# Patient Record
Sex: Male | Born: 1945 | Race: White | Hispanic: No | Marital: Married | Smoking: Never smoker
Health system: Southern US, Community
[De-identification: ages and names within clinical notes are randomized; demographics above are authoritative.]

## PROBLEM LIST (undated history)

## (undated) DIAGNOSIS — I10 Essential (primary) hypertension: Secondary | ICD-10-CM

## (undated) DIAGNOSIS — E119 Type 2 diabetes mellitus without complications: Secondary | ICD-10-CM

---

## 1998-04-26 ENCOUNTER — Emergency Department (HOSPITAL_COMMUNITY): Admission: EM | Admit: 1998-04-26 | Discharge: 1998-04-26 | Payer: Self-pay | Admitting: *Deleted

## 1998-04-26 ENCOUNTER — Encounter: Payer: Self-pay | Admitting: Emergency Medicine

## 1998-04-26 ENCOUNTER — Encounter: Payer: Self-pay | Admitting: Orthopedic Surgery

## 1998-04-28 ENCOUNTER — Ambulatory Visit: Admission: RE | Admit: 1998-04-28 | Discharge: 1998-04-28 | Payer: Self-pay | Admitting: Orthopedic Surgery

## 1998-12-28 ENCOUNTER — Ambulatory Visit (HOSPITAL_COMMUNITY): Admission: RE | Admit: 1998-12-28 | Discharge: 1998-12-28 | Payer: Self-pay | Admitting: Internal Medicine

## 1999-11-18 ENCOUNTER — Encounter: Payer: Self-pay | Admitting: Emergency Medicine

## 1999-11-18 ENCOUNTER — Emergency Department (HOSPITAL_COMMUNITY): Admission: EM | Admit: 1999-11-18 | Discharge: 1999-11-18 | Payer: Self-pay | Admitting: Emergency Medicine

## 2007-01-08 ENCOUNTER — Emergency Department (HOSPITAL_COMMUNITY): Admission: EM | Admit: 2007-01-08 | Discharge: 2007-01-08 | Payer: Self-pay | Admitting: Emergency Medicine

## 2007-06-24 ENCOUNTER — Ambulatory Visit (HOSPITAL_BASED_OUTPATIENT_CLINIC_OR_DEPARTMENT_OTHER): Admission: RE | Admit: 2007-06-24 | Discharge: 2007-06-25 | Payer: Self-pay | Admitting: Orthopedic Surgery

## 2010-08-14 NOTE — Op Note (Signed)
NAMEQUANTA, ROHER               ACCOUNT NO.:  [id number]   MEDICAL RECORD NO.:  [id number]          PATIENT TYPE:  AMB   LOCATION:  DSC                          FACILITY:  [facility]   PHYSICIAN:  Harvie Junior, M.D.   DATE OF BIRTH:  1945-10-29   DATE OF PROCEDURE:  06/24/2007  DATE OF DISCHARGE:                               OPERATIVE REPORT   PREOPERATIVE DIAGNOSIS:  Nonunion, medial malleolus.   POSTOPERATIVE DIAGNOSIS:  Nonunion, medial malleolus.   PRINCIPAL PROCEDURES:  1. Open reduction and internal fixation of nonunion medial malleolus      with hardware removal.  2. Hardware removal of deep hardware.  3. Harvesting of bone graft from the proximal tibial plateau.   SURGEON:  Harvie Junior, MD.   ASSISTANT:  Marshia Ly, PA.   ANESTHESIA:  General.   ESTIMATED BLOOD LOSS:  None.   TOTAL TOURNIQUET TIME:  About 90 minutes.   BRIEF HISTORY:  Mr. Karczewski is a 65 year old male with a long history of  having had a fall and a fracture of his medial malleolus.  We treated  him with an open reduction and internal fixation and initially had an  anatomic reduction, but there was some motion of the reduction early on  and we attempted to immobilize for a long period of time and get a bone  stimulator working on him, and ultimately he unfortunately went on to  nonunion.  He was swollen and painful and because of that problem, he  ultimately was taken to the operating room for open reduction and  internal fixation of his nonunion.   PROCEDURE:  The patient was taken to the operating room.  After adequate  anesthesia was obtained with general anesthetic, the patient was placed  on the operating table and the left leg was prepped and draped in the  usual sterile fashion. Following this, a small incision was made over  the medial aspect of the leg.  Subcutaneous tissues were taken down to  the level of the medial malleolus, the medial malleolar nonunion was  identified, and the  screws were removed from the medial side.  Once this  was completed, attention was turned towards the medial malleolus where  the fragment was taken down, the bone ends were rarified, and the  compressive tenaculum was put across there.  It did look like we could  compress the fragment and this gave a tiny bit, about a millimeter step-  off, in the medial gutter, but we certainly felt this was acceptable  given the opportunity to try to compress this to get it to heal.  At  this point, attention was turned to the proximal tibial plateau where an  incision was made over the lateral aspect of the proximal tibial plateau  just under Gerdy's tubercle.  A window was made and a trapdoor was  flapped backwards.  We went through this trapdoor to harvest some  cancellous graft.  Once this was completed, this wound was irrigated,  the trapdoor closed, an #0 Vicryl deep, a #2-0 Vicryl in the  subcuticular, and nylon at  the skin.  Attention was turned back to the  ankle where we rarified the bone on both sides and once this was  completed, attention was turned towards putting a graft in the fracture  site.  Once this was completed, a tenaculum was used to hold it  anatomically reduced.  Two 0.062 K-wires were advanced in the horizontal  fashion, and we initially knew that we were going to use a  neutralization plate so we put the neutralization plate on and marked  the area of the K-wires so that they could sneak through our distal  locking screws.  Once this was completed, the 0.062 K-wires were  advanced across the fracture site and a tension band technique was used  to lock the medial malleolus in place.  We got really an excellent  compressive fixation with bone graft and at that point we still felt  like, given the history here, that a neutralization plate was critical  and so we placed a neutralization plate distally so that the  neutralization in the three screws distally could come between the  two K-  wires and then one lateral and one anterior and posterior to the two K-  wires.  Once that was done, then the plate was locked down to the tibia  and compressed to the tibia.  This gave excellent form fitting around  the medial malleolus and once that was compression was being held the  three distal screws were locked in, in a locking fashion, and got great  fixation on that medial malleolus.  The attention was then turned  proximally where the plate was again compressed a little more proximally  and then locked in place.  Once that was completed, the wound was  irrigated and the final bone graft was put in place, x-rays were taken,  which showed an excellent alignment of the fracture and no evidence of  any kind of fracture gap.  At this point, the subcu was closed with an  #0 Vicryl and #2-0 Vicryl, and the skin with a #4-0 nylon running.  A  sterile compression dressing was applied as well as a U and a posterior  splint, and the patient taken to the recovery room where she was noted  to be in satisfactory condition.      Harvie Junior, M.D.  Electronically Signed     JLG/MEDQ  D:  06/24/2007  T:  06/24/2007  Job:  [id number]

## 2010-12-24 LAB — BASIC METABOLIC PANEL
BUN: 15
CO2: 25
Calcium: 9.4
Chloride: 106
Creatinine, Ser: 0.83
GFR calc Af Amer: >60
GFR calc non Af Amer: >60
Glucose, Bld: 87
Potassium: 4.5
Sodium: 140

## 2010-12-24 LAB — ANAEROBIC CULTURE

## 2010-12-24 LAB — WOUND CULTURE: Gram Stain: NONE SEEN

## 2011-12-11 ENCOUNTER — Ambulatory Visit (HOSPITAL_COMMUNITY)
Admission: RE | Admit: 2011-12-11 | Discharge: 2011-12-11 | Disposition: A | Discharge status: Home / Self Care | Payer: Medicare Other | Source: Ambulatory Visit | Attending: Internal Medicine | Admitting: Internal Medicine

## 2011-12-11 DIAGNOSIS — R0989 Other specified symptoms and signs involving the circulatory and respiratory systems: Secondary | ICD-10-CM

## 2011-12-11 DIAGNOSIS — I6529 Occlusion and stenosis of unspecified carotid artery: Secondary | ICD-10-CM | POA: Insufficient documentation

## 2011-12-11 NOTE — Progress Notes (Signed)
*  PRELIMINARY RESULTS* Vascular Ultrasound Carotid Duplex (Doppler) has been completed.  Preliminary findings: bilaterally no significant ICA stenosis with antegrade vertebral flow.  Farrel Demark, RDMS, RVT  12/11/2011, 10:25 AM

## 2014-03-03 ENCOUNTER — Encounter (HOSPITAL_COMMUNITY): Payer: Self-pay | Admitting: Emergency Medicine

## 2014-03-03 ENCOUNTER — Emergency Department (HOSPITAL_COMMUNITY)
Admission: EM | Admit: 2014-03-03 | Discharge: 2014-03-03 | Disposition: A | Discharge status: Home / Self Care | Payer: No Typology Code available for payment source | Attending: Emergency Medicine | Admitting: Emergency Medicine

## 2014-03-03 DIAGNOSIS — S6992XA Unspecified injury of left wrist, hand and finger(s), initial encounter: Secondary | ICD-10-CM | POA: Diagnosis present

## 2014-03-03 DIAGNOSIS — R233 Spontaneous ecchymoses: Secondary | ICD-10-CM | POA: Insufficient documentation

## 2014-03-03 DIAGNOSIS — Y998 Other external cause status: Secondary | ICD-10-CM | POA: Insufficient documentation

## 2014-03-03 DIAGNOSIS — S61412A Laceration without foreign body of left hand, initial encounter: Secondary | ICD-10-CM | POA: Diagnosis not present

## 2014-03-03 DIAGNOSIS — Y9389 Activity, other specified: Secondary | ICD-10-CM | POA: Insufficient documentation

## 2014-03-03 DIAGNOSIS — I10 Essential (primary) hypertension: Secondary | ICD-10-CM | POA: Insufficient documentation

## 2014-03-03 DIAGNOSIS — E119 Type 2 diabetes mellitus without complications: Secondary | ICD-10-CM | POA: Insufficient documentation

## 2014-03-03 DIAGNOSIS — Y92410 Unspecified street and highway as the place of occurrence of the external cause: Secondary | ICD-10-CM | POA: Diagnosis not present

## 2014-03-03 HISTORY — DX: Essential (primary) hypertension: I10

## 2014-03-03 HISTORY — DX: Type 2 diabetes mellitus without complications: E11.9

## 2014-03-03 MED ORDER — LIDOCAINE-EPINEPHRINE 1 %-1:100000 IJ SOLN
10.0000 mL | Freq: Once | INTRAMUSCULAR | Status: AC
  Administered 2014-03-03: 10 mL
  Filled 2014-03-03: qty 1
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
## 2014-03-03 NOTE — ED Notes (Signed)
Pt placed into gown and on monitor upon arrival to room. Pt monitored by blood pressure, pulse ox, and 5 lead. pts wallet, money clip, and cell phone given to pts wife David Robinson with pts permission.

## 2014-10-25 ENCOUNTER — Other Ambulatory Visit: Payer: Self-pay | Admitting: Internal Medicine

## 2014-10-25 DIAGNOSIS — R1011 Right upper quadrant pain: Secondary | ICD-10-CM

## 2014-10-28 ENCOUNTER — Ambulatory Visit
Admission: RE | Admit: 2014-10-28 | Discharge: 2014-10-28 | Disposition: A | Discharge status: Home / Self Care | Payer: Medicare Other | Source: Ambulatory Visit | Attending: Internal Medicine | Admitting: Internal Medicine

## 2014-10-28 DIAGNOSIS — R1011 Right upper quadrant pain: Secondary | ICD-10-CM

## 2014-12-28 ENCOUNTER — Other Ambulatory Visit (HOSPITAL_COMMUNITY): Payer: Self-pay | Admitting: Internal Medicine

## 2014-12-28 DIAGNOSIS — R0989 Other specified symptoms and signs involving the circulatory and respiratory systems: Secondary | ICD-10-CM

## 2015-01-02 ENCOUNTER — Ambulatory Visit (HOSPITAL_COMMUNITY)
Admission: RE | Admit: 2015-01-02 | Discharge: 2015-01-02 | Disposition: A | Discharge status: Home / Self Care | Payer: Medicare Other | Source: Ambulatory Visit | Attending: Internal Medicine | Admitting: Internal Medicine

## 2015-01-02 DIAGNOSIS — E119 Type 2 diabetes mellitus without complications: Secondary | ICD-10-CM | POA: Diagnosis not present

## 2015-01-02 DIAGNOSIS — R0989 Other specified symptoms and signs involving the circulatory and respiratory systems: Secondary | ICD-10-CM | POA: Insufficient documentation

## 2015-01-02 DIAGNOSIS — I10 Essential (primary) hypertension: Secondary | ICD-10-CM | POA: Diagnosis not present

## 2015-01-02 NOTE — Progress Notes (Signed)
*  PRELIMINARY RESULTS* Vascular Ultrasound Carotid Duplex (Doppler) has been completed.  Findings suggest 1-39% internal carotid artery stenosis bilaterally. Vertebral arteries are patent with antegrade flow.  01/02/2015 9:05 AM Gertie Fey, RVT, RDCS, RDMS

## 2015-06-13 ENCOUNTER — Other Ambulatory Visit: Payer: Self-pay | Admitting: Internal Medicine

## 2015-06-13 ENCOUNTER — Ambulatory Visit
Admission: RE | Admit: 2015-06-13 | Discharge: 2015-06-13 | Disposition: A | Discharge status: Home / Self Care | Payer: Medicare Other | Source: Ambulatory Visit | Attending: Internal Medicine | Admitting: Internal Medicine

## 2015-06-13 DIAGNOSIS — M79605 Pain in left leg: Secondary | ICD-10-CM

## 2015-12-19 ENCOUNTER — Ambulatory Visit
Admission: RE | Admit: 2015-12-19 | Discharge: 2015-12-19 | Disposition: A | Discharge status: Home / Self Care | Payer: Medicare Other | Source: Ambulatory Visit | Attending: Internal Medicine | Admitting: Internal Medicine

## 2015-12-19 ENCOUNTER — Other Ambulatory Visit: Payer: Self-pay | Admitting: Internal Medicine

## 2015-12-19 DIAGNOSIS — R059 Cough, unspecified: Secondary | ICD-10-CM

## 2015-12-19 DIAGNOSIS — R05 Cough: Secondary | ICD-10-CM

## 2019-03-15 ENCOUNTER — Emergency Department (HOSPITAL_COMMUNITY): Payer: Medicare Other

## 2019-03-15 ENCOUNTER — Other Ambulatory Visit: Payer: Self-pay

## 2019-03-15 ENCOUNTER — Encounter (HOSPITAL_COMMUNITY): Payer: Self-pay | Admitting: *Deleted

## 2019-03-15 ENCOUNTER — Inpatient Hospital Stay (HOSPITAL_COMMUNITY)
Admission: EM | Admit: 2019-03-15 | Discharge: 2019-04-02 | DRG: 208 | Disposition: E | Payer: Medicare Other | Attending: Internal Medicine | Admitting: Internal Medicine

## 2019-03-15 DIAGNOSIS — Z66 Do not resuscitate: Secondary | ICD-10-CM | POA: Diagnosis not present

## 2019-03-15 DIAGNOSIS — I509 Heart failure, unspecified: Secondary | ICD-10-CM | POA: Diagnosis not present

## 2019-03-15 DIAGNOSIS — I10 Essential (primary) hypertension: Secondary | ICD-10-CM | POA: Diagnosis present

## 2019-03-15 DIAGNOSIS — K5900 Constipation, unspecified: Secondary | ICD-10-CM | POA: Diagnosis not present

## 2019-03-15 DIAGNOSIS — Z6830 Body mass index (BMI) 30.0-30.9, adult: Secondary | ICD-10-CM | POA: Diagnosis not present

## 2019-03-15 DIAGNOSIS — Z23 Encounter for immunization: Secondary | ICD-10-CM

## 2019-03-15 DIAGNOSIS — N179 Acute kidney failure, unspecified: Secondary | ICD-10-CM | POA: Diagnosis present

## 2019-03-15 DIAGNOSIS — Z6838 Body mass index (BMI) 38.0-38.9, adult: Secondary | ICD-10-CM | POA: Diagnosis not present

## 2019-03-15 DIAGNOSIS — R451 Restlessness and agitation: Secondary | ICD-10-CM | POA: Diagnosis not present

## 2019-03-15 DIAGNOSIS — J9601 Acute respiratory failure with hypoxia: Secondary | ICD-10-CM | POA: Diagnosis present

## 2019-03-15 DIAGNOSIS — I959 Hypotension, unspecified: Secondary | ICD-10-CM | POA: Diagnosis not present

## 2019-03-15 DIAGNOSIS — Z79899 Other long term (current) drug therapy: Secondary | ICD-10-CM

## 2019-03-15 DIAGNOSIS — E119 Type 2 diabetes mellitus without complications: Secondary | ICD-10-CM | POA: Diagnosis present

## 2019-03-15 DIAGNOSIS — R630 Anorexia: Secondary | ICD-10-CM | POA: Diagnosis present

## 2019-03-15 DIAGNOSIS — Z7984 Long term (current) use of oral hypoglycemic drugs: Secondary | ICD-10-CM | POA: Diagnosis not present

## 2019-03-15 DIAGNOSIS — E669 Obesity, unspecified: Secondary | ICD-10-CM | POA: Diagnosis present

## 2019-03-15 DIAGNOSIS — I82462 Acute embolism and thrombosis of left calf muscular vein: Secondary | ICD-10-CM | POA: Diagnosis present

## 2019-03-15 DIAGNOSIS — J1289 Other viral pneumonia: Secondary | ICD-10-CM | POA: Diagnosis present

## 2019-03-15 DIAGNOSIS — E1169 Type 2 diabetes mellitus with other specified complication: Secondary | ICD-10-CM | POA: Diagnosis present

## 2019-03-15 DIAGNOSIS — F419 Anxiety disorder, unspecified: Secondary | ICD-10-CM | POA: Diagnosis not present

## 2019-03-15 DIAGNOSIS — G9341 Metabolic encephalopathy: Secondary | ICD-10-CM | POA: Diagnosis not present

## 2019-03-15 DIAGNOSIS — Z9981 Dependence on supplemental oxygen: Secondary | ICD-10-CM

## 2019-03-15 DIAGNOSIS — I82451 Acute embolism and thrombosis of right peroneal vein: Secondary | ICD-10-CM | POA: Diagnosis present

## 2019-03-15 DIAGNOSIS — E872 Acidosis: Secondary | ICD-10-CM | POA: Diagnosis not present

## 2019-03-15 DIAGNOSIS — J80 Acute respiratory distress syndrome: Secondary | ICD-10-CM

## 2019-03-15 DIAGNOSIS — E662 Morbid (severe) obesity with alveolar hypoventilation: Secondary | ICD-10-CM | POA: Diagnosis not present

## 2019-03-15 DIAGNOSIS — Z01818 Encounter for other preprocedural examination: Secondary | ICD-10-CM

## 2019-03-15 DIAGNOSIS — E118 Type 2 diabetes mellitus with unspecified complications: Secondary | ICD-10-CM | POA: Diagnosis not present

## 2019-03-15 DIAGNOSIS — R7989 Other specified abnormal findings of blood chemistry: Secondary | ICD-10-CM | POA: Diagnosis not present

## 2019-03-15 DIAGNOSIS — U071 COVID-19: Principal | ICD-10-CM

## 2019-03-15 DIAGNOSIS — J1282 Pneumonia due to coronavirus disease 2019: Secondary | ICD-10-CM | POA: Diagnosis present

## 2019-03-15 DIAGNOSIS — R402434 Glasgow coma scale score 3-8, 24 hours or more after hospital admission: Secondary | ICD-10-CM | POA: Diagnosis not present

## 2019-03-15 DIAGNOSIS — Z452 Encounter for adjustment and management of vascular access device: Secondary | ICD-10-CM

## 2019-03-15 DIAGNOSIS — R0602 Shortness of breath: Secondary | ICD-10-CM | POA: Diagnosis not present

## 2019-03-15 DIAGNOSIS — F1010 Alcohol abuse, uncomplicated: Secondary | ICD-10-CM | POA: Diagnosis present

## 2019-03-15 LAB — HEMOGLOBIN A1C
Hgb A1c MFr Bld: 6.6 % — ABNORMAL HIGH (ref 4.8–5.6)
Mean Plasma Glucose: 142.72 mg/dL

## 2019-03-15 LAB — CBC WITH DIFFERENTIAL/PLATELET
Abs Immature Granulocytes: 0.05 10*3/uL (ref 0.00–0.07)
Basophils Absolute: 0 10*3/uL (ref 0.0–0.1)
Basophils Relative: 0 %
Eosinophils Absolute: 0 10*3/uL (ref 0.0–0.5)
Eosinophils Relative: 0 %
HCT: 44 % (ref 39.0–52.0)
Hemoglobin: 15.2 g/dL (ref 13.0–17.0)
Immature Granulocytes: 1 %
Lymphocytes Relative: 7 %
Lymphs Abs: 0.5 10*3/uL — ABNORMAL LOW (ref 0.7–4.0)
MCH: 31.6 pg (ref 26.0–34.0)
MCHC: 34.5 g/dL (ref 30.0–36.0)
MCV: 91.5 fL (ref 80.0–100.0)
Monocytes Absolute: 0.3 10*3/uL (ref 0.1–1.0)
Monocytes Relative: 4 %
Neutro Abs: 6.2 10*3/uL (ref 1.7–7.7)
Neutrophils Relative %: 88 %
Platelets: 207 10*3/uL (ref 150–400)
RBC: 4.81 MIL/uL (ref 4.22–5.81)
RDW: 14.3 % (ref 11.5–15.5)
WBC Morphology: >10
WBC: 7 10*3/uL (ref 4.0–10.5)
nRBC: 0 % (ref 0.0–0.2)

## 2019-03-15 LAB — RESPIRATORY PANEL BY RT PCR (FLU A&B, COVID)
Influenza A by PCR: NEGATIVE
Influenza B by PCR: NEGATIVE
SARS Coronavirus 2 by RT PCR: POSITIVE — AB

## 2019-03-15 LAB — COMPREHENSIVE METABOLIC PANEL
ALT: 21 U/L (ref 0–44)
AST: 63 U/L — ABNORMAL HIGH (ref 15–41)
Albumin: 2.7 g/dL — ABNORMAL LOW (ref 3.5–5.0)
Alkaline Phosphatase: 77 U/L (ref 38–126)
Anion gap: 16 — ABNORMAL HIGH (ref 5–15)
BUN: 38 mg/dL — ABNORMAL HIGH (ref 8–23)
CO2: 22 mmol/L (ref 22–32)
Calcium: 8.3 mg/dL — ABNORMAL LOW (ref 8.9–10.3)
Chloride: 98 mmol/L (ref 98–111)
Creatinine, Ser: 1.68 mg/dL — ABNORMAL HIGH (ref 0.61–1.24)
GFR calc Af Amer: 46 mL/min — ABNORMAL LOW (ref >60)
GFR calc non Af Amer: 39 mL/min — ABNORMAL LOW (ref >60)
Glucose, Bld: 185 mg/dL — ABNORMAL HIGH (ref 70–99)
Potassium: 4.1 mmol/L (ref 3.5–5.1)
Sodium: 136 mmol/L (ref 135–145)
Total Bilirubin: 1.1 mg/dL (ref 0.3–1.2)
Total Protein: 6.2 g/dL — ABNORMAL LOW (ref 6.5–8.1)

## 2019-03-15 LAB — POCT I-STAT 7, (LYTES, BLD GAS, ICA,H+H)
Acid-base deficit: 2 mmol/L (ref 0.0–2.0)
Bicarbonate: 22.3 mmol/L (ref 20.0–28.0)
Calcium, Ion: 1.16 mmol/L (ref 1.15–1.40)
HCT: 41 % (ref 39.0–52.0)
Hemoglobin: 13.9 g/dL (ref 13.0–17.0)
O2 Saturation: 87 %
Patient temperature: 97.9
Potassium: 3.8 mmol/L (ref 3.5–5.1)
Sodium: 133 mmol/L — ABNORMAL LOW (ref 135–145)
TCO2: 23 mmol/L (ref 22–32)
pCO2 arterial: 36.3 mmHg (ref 32.0–48.0)
pH, Arterial: 7.395 (ref 7.350–7.450)
pO2, Arterial: 52 mmHg — ABNORMAL LOW (ref 83.0–108.0)

## 2019-03-15 LAB — TROPONIN I (HIGH SENSITIVITY)
Troponin I (High Sensitivity): 34 ng/L — ABNORMAL HIGH (ref <18)
Troponin I (High Sensitivity): 40 ng/L — ABNORMAL HIGH (ref <18)

## 2019-03-15 LAB — D-DIMER, QUANTITATIVE: D-Dimer, Quant: >20 ug/mL-FEU — ABNORMAL HIGH (ref 0.00–0.50)

## 2019-03-15 LAB — PROCALCITONIN: Procalcitonin: 0.18 ng/mL

## 2019-03-15 LAB — LACTIC ACID, PLASMA: Lactic Acid, Venous: 2.6 mmol/L (ref 0.5–1.9)

## 2019-03-15 LAB — GLUCOSE, CAPILLARY: Glucose-Capillary: 193 mg/dL — ABNORMAL HIGH (ref 70–99)

## 2019-03-15 LAB — FIBRINOGEN: Fibrinogen: 416 mg/dL (ref 210–475)

## 2019-03-15 LAB — LACTATE DEHYDROGENASE: LDH: 694 U/L — ABNORMAL HIGH (ref 98–192)

## 2019-03-15 LAB — C-REACTIVE PROTEIN: CRP: 22.2 mg/dL — ABNORMAL HIGH (ref <1.0)

## 2019-03-15 LAB — POC SARS CORONAVIRUS 2 AG -  ED: SARS Coronavirus 2 Ag: NEGATIVE

## 2019-03-15 LAB — BRAIN NATRIURETIC PEPTIDE: B Natriuretic Peptide: 113.6 pg/mL — ABNORMAL HIGH (ref 0.0–100.0)

## 2019-03-15 LAB — FERRITIN: Ferritin: 595 ng/mL — ABNORMAL HIGH (ref 24–336)

## 2019-03-15 LAB — ABO/RH: ABO/RH(D): O POS

## 2019-03-15 MED ORDER — SODIUM CHLORIDE 0.9 % IV SOLN
1.0000 g | Freq: Once | INTRAVENOUS | Status: AC
  Administered 2019-03-15: 13:00:00 1 g via INTRAVENOUS
  Filled 2019-03-15: qty 10

## 2019-03-15 MED ORDER — SODIUM CHLORIDE 0.9% FLUSH
3.0000 mL | Freq: Two times a day (BID) | INTRAVENOUS | Status: DC
  Administered 2019-03-15 – 2019-03-21 (×13): 3 mL via INTRAVENOUS

## 2019-03-15 MED ORDER — DEXAMETHASONE SODIUM PHOSPHATE 10 MG/ML IJ SOLN
6.0000 mg | INTRAMUSCULAR | Status: DC
  Administered 2019-03-15: 6 mg via INTRAVENOUS
  Filled 2019-03-15: qty 1

## 2019-03-15 MED ORDER — ALBUTEROL SULFATE HFA 108 (90 BASE) MCG/ACT IN AERS
2.0000 | INHALATION_SPRAY | Freq: Once | RESPIRATORY_TRACT | Status: AC
Start: 2019-03-15 — End: 2019-03-15
  Administered 2019-03-15: 2 via RESPIRATORY_TRACT
  Filled 2019-03-15: qty 6.7

## 2019-03-15 MED ORDER — ASCORBIC ACID 500 MG PO TABS
500.0000 mg | ORAL_TABLET | Freq: Every day | ORAL | Status: DC
  Administered 2019-03-16 – 2019-03-21 (×6): 500 mg via ORAL
  Filled 2019-03-15 (×6): qty 1

## 2019-03-15 MED ORDER — ALBUTEROL SULFATE HFA 108 (90 BASE) MCG/ACT IN AERS
2.0000 | INHALATION_SPRAY | Freq: Four times a day (QID) | RESPIRATORY_TRACT | Status: DC
  Administered 2019-03-15 – 2019-03-16 (×3): 2 via RESPIRATORY_TRACT
  Filled 2019-03-15: qty 6.7

## 2019-03-15 MED ORDER — SODIUM CHLORIDE 0.9% FLUSH
3.0000 mL | Freq: Two times a day (BID) | INTRAVENOUS | Status: DC
  Administered 2019-03-15 – 2019-03-21 (×13): 3 mL via INTRAVENOUS

## 2019-03-15 MED ORDER — FLEET ENEMA 7-19 GM/118ML RE ENEM: 1.0000 | ENEMA | Freq: Once | RECTAL | Status: DC | PRN

## 2019-03-15 MED ORDER — INSULIN GLARGINE 100 UNIT/ML ~~LOC~~ SOLN
15.0000 [IU] | Freq: Every day | SUBCUTANEOUS | Status: DC
  Administered 2019-03-15 – 2019-03-16 (×2): 15 [IU] via SUBCUTANEOUS
  Filled 2019-03-15 (×4): qty 0.15

## 2019-03-15 MED ORDER — SODIUM CHLORIDE 0.9 % IV SOLN: 250.0000 mL | INTRAVENOUS | Status: DC | PRN

## 2019-03-15 MED ORDER — METHYLPREDNISOLONE SODIUM SUCC 125 MG IJ SOLR
60.0000 mg | Freq: Two times a day (BID) | INTRAMUSCULAR | Status: DC
  Administered 2019-03-15 – 2019-03-16 (×2): 60 mg via INTRAVENOUS
  Filled 2019-03-15 (×2): qty 2

## 2019-03-15 MED ORDER — ACETAMINOPHEN 325 MG PO TABS
650.0000 mg | ORAL_TABLET | Freq: Once | ORAL | Status: DC
  Filled 2019-03-15: qty 2

## 2019-03-15 MED ORDER — BISACODYL 5 MG PO TBEC
5.0000 mg | DELAYED_RELEASE_TABLET | Freq: Every day | ORAL | Status: DC | PRN
  Administered 2019-03-18 – 2019-03-20 (×2): 5 mg via ORAL
  Filled 2019-03-15 (×2): qty 1

## 2019-03-15 MED ORDER — TOCILIZUMAB 400 MG/20ML IV SOLN
800.0000 mg | Freq: Once | INTRAVENOUS | Status: AC
  Administered 2019-03-15: 800 mg via INTRAVENOUS
  Filled 2019-03-15: qty 40

## 2019-03-15 MED ORDER — OXYCODONE HCL 5 MG PO TABS
5.0000 mg | ORAL_TABLET | ORAL | Status: DC | PRN
  Administered 2019-03-17 – 2019-03-19 (×2): 5 mg via ORAL
  Filled 2019-03-15 (×2): qty 1

## 2019-03-15 MED ORDER — ACETAMINOPHEN 325 MG PO TABS
650.0000 mg | ORAL_TABLET | Freq: Four times a day (QID) | ORAL | Status: DC | PRN
  Administered 2019-03-17 – 2019-03-18 (×2): 650 mg via ORAL
  Filled 2019-03-15 (×3): qty 2

## 2019-03-15 MED ORDER — SODIUM CHLORIDE 0.9 % IV SOLN: 500.0000 mg | INTRAVENOUS | Status: DC

## 2019-03-15 MED ORDER — HYDROCOD POLST-CPM POLST ER 10-8 MG/5ML PO SUER
5.0000 mL | Freq: Two times a day (BID) | ORAL | Status: DC | PRN
  Administered 2019-03-15 – 2019-03-20 (×5): 5 mL via ORAL
  Filled 2019-03-15 (×5): qty 5

## 2019-03-15 MED ORDER — ZINC SULFATE 220 (50 ZN) MG PO CAPS
220.0000 mg | ORAL_CAPSULE | Freq: Every day | ORAL | Status: DC
  Administered 2019-03-16 – 2019-03-21 (×6): 220 mg via ORAL
  Filled 2019-03-15 (×6): qty 1

## 2019-03-15 MED ORDER — ONDANSETRON HCL 4 MG/2ML IJ SOLN: 4.0000 mg | Freq: Four times a day (QID) | INTRAMUSCULAR | Status: DC | PRN

## 2019-03-15 MED ORDER — INSULIN ASPART 100 UNIT/ML ~~LOC~~ SOLN
0.0000 [IU] | Freq: Three times a day (TID) | SUBCUTANEOUS | Status: DC
  Administered 2019-03-16: 8 [IU] via SUBCUTANEOUS
  Administered 2019-03-16 – 2019-03-17 (×3): 5 [IU] via SUBCUTANEOUS
  Administered 2019-03-17: 3 [IU] via SUBCUTANEOUS
  Administered 2019-03-17 – 2019-03-18 (×3): 5 [IU] via SUBCUTANEOUS
  Administered 2019-03-18 – 2019-03-20 (×4): 3 [IU] via SUBCUTANEOUS
  Administered 2019-03-20: 5 [IU] via SUBCUTANEOUS
  Administered 2019-03-20 – 2019-03-21 (×3): 3 [IU] via SUBCUTANEOUS

## 2019-03-15 MED ORDER — SODIUM CHLORIDE 0.9 % IV SOLN: 2.0000 g | INTRAVENOUS | Status: DC

## 2019-03-15 MED ORDER — INSULIN ASPART 100 UNIT/ML ~~LOC~~ SOLN
0.0000 [IU] | Freq: Every day | SUBCUTANEOUS | Status: DC
  Administered 2019-03-16: 3 [IU] via SUBCUTANEOUS
  Administered 2019-03-17 – 2019-03-18 (×2): 2 [IU] via SUBCUTANEOUS

## 2019-03-15 MED ORDER — DIPHENHYDRAMINE HCL 50 MG/ML IJ SOLN
25.0000 mg | Freq: Once | INTRAMUSCULAR | Status: DC
  Filled 2019-03-15: qty 1

## 2019-03-15 MED ORDER — ASPIRIN 81 MG PO CHEW: 324.0000 mg | CHEWABLE_TABLET | Freq: Once | ORAL | Status: DC

## 2019-03-15 MED ORDER — SODIUM CHLORIDE 0.9 % IV SOLN
100.0000 mg | Freq: Every day | INTRAVENOUS | Status: AC
  Administered 2019-03-16 – 2019-03-19 (×4): 100 mg via INTRAVENOUS
  Filled 2019-03-15 (×4): qty 20

## 2019-03-15 MED ORDER — ENOXAPARIN SODIUM 60 MG/0.6ML ~~LOC~~ SOLN
60.0000 mg | SUBCUTANEOUS | Status: DC
  Filled 2019-03-15: qty 0.6

## 2019-03-15 MED ORDER — TOCILIZUMAB 400 MG/20ML IV SOLN: 800.0000 mg | Freq: Once | INTRAVENOUS | Status: DC

## 2019-03-15 MED ORDER — SODIUM CHLORIDE 0.9% FLUSH
3.0000 mL | INTRAVENOUS | Status: DC | PRN
  Administered 2019-03-20: 10 mL via INTRAVENOUS

## 2019-03-15 MED ORDER — SODIUM CHLORIDE 0.9 % IV SOLN
200.0000 mg | Freq: Once | INTRAVENOUS | Status: AC
  Administered 2019-03-15: 200 mg via INTRAVENOUS
  Filled 2019-03-15: qty 40

## 2019-03-15 MED ORDER — ENOXAPARIN SODIUM 150 MG/ML ~~LOC~~ SOLN
1.0000 mg/kg | Freq: Two times a day (BID) | SUBCUTANEOUS | Status: DC
  Administered 2019-03-15 – 2019-03-20 (×11): 125 mg via SUBCUTANEOUS
  Filled 2019-03-15 (×15): qty 0.83

## 2019-03-15 MED ORDER — POLYETHYLENE GLYCOL 3350 17 G PO PACK
17.0000 g | PACK | Freq: Every day | ORAL | Status: DC | PRN
  Filled 2019-03-15: qty 1

## 2019-03-15 MED ORDER — SODIUM CHLORIDE 0.9 % IV SOLN: 1.0000 g | Freq: Once | INTRAVENOUS | Status: DC

## 2019-03-15 MED ORDER — SODIUM CHLORIDE 0.9 % IV BOLUS
500.0000 mL | Freq: Once | INTRAVENOUS | Status: AC
  Administered 2019-03-15: 500 mL via INTRAVENOUS

## 2019-03-15 MED ORDER — SODIUM CHLORIDE 0.9 % IV SOLN
500.0000 mg | Freq: Once | INTRAVENOUS | Status: AC
  Administered 2019-03-15: 500 mg via INTRAVENOUS
  Filled 2019-03-15: qty 500

## 2019-03-15 MED ORDER — SODIUM CHLORIDE 0.9% IV SOLUTION: Freq: Once | INTRAVENOUS | Status: DC

## 2019-03-15 MED ORDER — FUROSEMIDE 10 MG/ML IJ SOLN
20.0000 mg | Freq: Once | INTRAMUSCULAR | Status: DC
  Filled 2019-03-15: qty 2

## 2019-03-15 MED ORDER — ENOXAPARIN SODIUM 150 MG/ML ~~LOC~~ SOLN
1.0000 mg/kg | Freq: Two times a day (BID) | SUBCUTANEOUS | Status: DC
  Filled 2019-03-15 (×2): qty 0.83

## 2019-03-15 MED ORDER — ONDANSETRON HCL 4 MG PO TABS: 4.0000 mg | ORAL_TABLET | Freq: Four times a day (QID) | ORAL | Status: DC | PRN

## 2019-03-15 MED ORDER — CHLORHEXIDINE GLUCONATE CLOTH 2 % EX PADS
6.0000 | MEDICATED_PAD | Freq: Every day | CUTANEOUS | Status: DC
  Administered 2019-03-16 – 2019-03-21 (×6): 6 via TOPICAL

## 2019-03-15 NOTE — H&P (Signed)
History and Physical    KACIN DANCY LXB:[id number] DOB: 1945/08/14 DOA: 03/05/2019  PCP: Burton Apley, MD Consultants:  None Patient coming from:  Home - lives with ; NOKDorina Hoyer, [phone], [phone]  Chief Complaint: SOB  HPI: David Robinson is a 73 y.o. male with medical history significant of HTN and DM presenting with SOB.  He reports getting sick about a week ago, with SOB developing on Friday.  He was reluctant to come to the ER, but today was too weak to lift himself out of a chair and he slid to the floor.  +cough, nonproductive. No fever.  No GI symptoms other than decreased PO intake.  I spoke with his wife.  He has been sick for several days and became SOB Friday.  Increasing weakness, and this AM he was unable to hold himself up.  He has been consented for Actemra should he become intubated and need it.   ED Course:  Hypoxic, 50s with EMS.  Improved on NRB + HFNC.  PCCM consulted and said [facility] admission is ok.  Review of Systems: As per HPI; otherwise review of systems reviewed and negative.   Ambulatory Status:  Ambulates without assistance  Past Medical History:  Diagnosis Date  . Diabetes mellitus without complication (HCC)   . Hypertension     History reviewed. No pertinent surgical history.  Social History   Socioeconomic History  . Marital status: Married    Spouse name: Not on file  . Number of children: Not on file  . Years of education: Not on file  . Highest education level: Not on file  Occupational History  . Not on file  Tobacco Use  . Smoking status: Never Smoker  . Smokeless tobacco: Never Used  Substance and Sexual Activity  . Alcohol use: Never  . Drug use: Never  . Sexual activity: Not on file  Other Topics Concern  . Not on file  Social History Narrative  . Not on file   Social Determinants of Health   Financial Resource Strain:   . Difficulty of Paying Living Expenses: Not on file  Food Insecurity:   . Worried  About Programme researcher, broadcasting/film/video in the Last Year: Not on file  . Ran Out of Food in the Last Year: Not on file  Transportation Needs:   . Lack of Transportation (Medical): Not on file  . Lack of Transportation (Non-Medical): Not on file  Physical Activity:   . Days of Exercise per Week: Not on file  . Minutes of Exercise per Session: Not on file  Stress:   . Feeling of Stress : Not on file  Social Connections:   . Frequency of Communication with Friends and Family: Not on file  . Frequency of Social Gatherings with Friends and Family: Not on file  . Attends Religious Services: Not on file  . Active Member of Clubs or Organizations: Not on file  . Attends Banker Meetings: Not on file  . Marital Status: Not on file  Intimate Partner Violence:   . Fear of Current or Ex-Partner: Not on file  . Emotionally Abused: Not on file  . Physically Abused: Not on file  . Sexually Abused: Not on file    Not on File  No family history on file.  Prior to Admission medications   Medication Sig Start Date End Date Taking? Authorizing Provider  amLODipine-benazepril (LOTREL) 10-20 MG capsule Take 1 capsule by mouth daily. 01/22/19  Yes [provider]  chlorthalidone (HYGROTON) 25 MG tablet Take 25 mg by mouth daily. 01/22/19  Yes [provider]  gemfibrozil (LOPID) 600 MG tablet Take 600 mg by mouth 2 (two) times daily. 01/22/19  Yes [provider]  GLIPIZIDE XL 10 MG 24 hr tablet Take 10 mg by mouth daily. 10/03/18  Yes [provider]  pioglitazone (ACTOS) 30 MG tablet Take 30 mg by mouth daily. 01/22/19  Yes [provider]    Physical Exam: Vitals:   04/01/2019 1514 03/12/2019 1515 04/01/2019 1530 03/31/2019 1600  BP:  (!) 138/57 118/70 135/63  Pulse: 95 98 98 93  Resp: 20 (!) 30 (!) 24 (!) 41  Temp:      TempSrc:      SpO2: 91% (!) 84% (!) 84% (!) 85%  Weight:      Height:         . General:  Appears ill, anxious, repeatedly asking me  not to let him die . Eyes:  PERRL, EOMI, normal lids, iris . ENT:  grossly normal hearing, lips & tongue, dry mm . Neck:  no LAD, masses or thyromegaly . Cardiovascular:  RRR, no m/r/g. No LE edema.  Marland Kitchen. Respiratory:   Scattered rhonchi.  Increased respiratory effort.  Dyspneic with tachypnea with minimal conversation.  Sats drop immediately if NRB is moved. . Abdomen:  soft, NT, ND, NABS . Skin:  no rash or induration seen on limited exam . Musculoskeletal:  grossly normal tone BUE/BLE, good ROM, no bony abnormality . Psychiatric:  Anxious mood and affect, speech fluent and appropriate, AOx3 . Neurologic:  CN 2-12 grossly intact, moves all extremities in coordinated fashion    Radiological Exams on Admission: DG Chest Portable 1 View  Result Date: 03/12/2019 CLINICAL DATA:  Shortness of breath EXAM: PORTABLE CHEST 1 VIEW COMPARISON:  12/19/2015 FINDINGS: Lung volumes are low with diffuse interstitial and airspace opacities. Cardiomediastinal contours are likely normal accounting for low depth of inspiration and portable technique. No signs of lobar consolidation or visible effusion. IMPRESSION: Diffuse interstitial and airspace opacities may represent pneumonia or edema. Electronically Signed   By: Donzetta KohutGeoffrey  Wile M.D.   On: 03/25/2019 09:47    EKG: Independently reviewed.  NSR with rate 96; LVH, RBBB, LAFB; no evidence of acute ischemia   Labs on Admission: I have personally reviewed the available labs and imaging studies at the time of the admission.  Pertinent labs:   Glucose 185 BUN 38/Creatinine 1.68/GFR 39 Anion gap 16 Albumin 2.7 AST 63/ALT 21 BNP 113.6 HS troponin 34, 40 Lactate 2.6 Normal CBC with lymphopenia LDH 694 Ferritin 595 CRP 22.2 Procalcitonin 0.18 D-dimer >20 Fibrinogen 416 A1c 6.6 COVID POSITIVE ABG: 7.395/36.3/52.0/87%   Assessment/Plan Principal Problem:   Acute respiratory failure with hypoxia (HCC) Active Problems:   Acute respiratory distress  syndrome (ARDS) due to COVID-19 virus (HCC)   Diabetes mellitus type 2 in obese (HCC)   Essential hypertension   Acute respiratory failure with hypoxia, ARDS due to COVID-19 infection -Patient with presenting with SOB and cough -Initial O2 sats in 50s by EMS -Mild anorexia noted without the presence of other GI symptoms -He does not have a usual home O2 requirement and is currently requiring NRB AND HFNC [place] O2 with persistent O2 sats 85-90% -COVID POSITIVE -The patient has comorbidities which may increase the risk for ARDS/MODS including: age, HTN, DM, obesity -Exam is concerning for development of ARDS/MODS due to respiratory distress -Pertinent labs concerning for COVID include lymphopenia; increased  BUN/Creatinine; markedly elevated D-dimer (>20!); markedly elevated CRP (>>7); increased LDH; increased ferritin; increased fibrinogen -CXR with multifocal opacities which may be c/w COVID vs. Multifocal PNA -Will treat with broad-spectrum antibiotics given procalcitonin >0.1.   -Will admit to [facility] ICU for further evaluation, close monitoring, and treatment -At this time, will attempt to avoid use of aerosolized medications and use HFAs instead -Will check daily labs including BMP with Mag, Phos; LFTs; CBC with differential; CRP (q12h); ferritin; fibrinogen; D-dimer (q12h) -Consider Echo (particularly if troponin is significantly elevated)  -Will order steroids (Decadron) and Remdesivir (pharmacy consult) given +COVID test, +CXR, and hypoxia <94% on room air -Based on marked hypoxia as well as markedly elevated CRP, patient/wife have been counseled regarding Tocilizumab 8 mg/kg x1; however, since he is not intubated I am not able to currently order the medication at this time -Will attempt to maintain euvolemia to a net negative fluid status -Will ask the patient to maintain an awake prone position for 16+ hours a day, if possible, with a minimum of 2-3 hours at a time -Given high-risk for  ARDS/MODS, PCCM consultation was obtained (see note from Dr. Molli Knock) -With D-dimer >20, will use Heparin drip for empiric VTE treatment at this time; suggest CTA when patient is more clinically stable -Patient was seen wearing full PPE including: gown, gloves, head cover, N95, and face shield; donning and doffing was in compliance with current standards. -Will add Vitamin C and Zinc  HTN -Hold BP medications due to renal dysfunction as well as normal/borderline BPs -Can add prn hydralazine if needed  DM in obese -Hold Actos and Glipizide -Will cover with moderate-scale SSI -BMI 38; Weight loss should be encouraged with outpatient PCP/bariatric medicine after d/c    Total critical care time: 65 minutes Critical care time was exclusive of separately billable procedures and treating other patients. Critical care was necessary to treat or prevent imminent or life-threatening deterioration. Critical care was time spent personally by me on the following activities: development of treatment plan with patient and/or surrogate as well as nursing, discussions with consultants, evaluation of patient's response to treatment, examination of patient, obtaining history from patient or surrogate, ordering and performing treatments and interventions, ordering and review of laboratory studies, ordering and review of radiographic studies, pulse oximetry and re-evaluation of patient's condition.    DVT prophylaxis:  Heparin drip Code Status:  Full - confirmed with patient Family Communication: None present; I spoke with the patient's wife by telephone. Disposition Plan:  Home once clinically improved Consults called: PCCM Admission status: Admit - It is my clinical opinion that admission to INPATIENT is reasonable and necessary because of the expectation that this patient will require hospital care that crosses at least 2 midnights to treat this condition based on the medical complexity of the problems  presented.  Given the aforementioned information, the predictability of an adverse outcome is felt to be significant.      Jonah Blue MD [facility]   How to contact the [facility] Attending or Consulting provider 7A - 7P or covering provider during after hours 7P -7A, for this patient?  1. Check the care team in [facility] and look for a) attending/consulting [facility] provider listed and b) the [facility] team listed 2. Log into [email or web address] and use 's universal password to access. If you do not have the password, please contact the hospital operator. 3. Locate the [facility] provider you are looking for under [facility] and page to a number that you can  be directly reached. 4. If you still have difficulty reaching the provider, please page the [facility] (Director on Call) for the Hospitalists listed on amion for assistance.   2019/03/23, 5:13 PM

## 2019-03-15 NOTE — Consult Note (Signed)
NAME:  David Robinson, MRN:  [id number], DOB:  02/12/1946, LOS: 0 ADMISSION DATE:  03/24/2019, CONSULTATION DATE:  03/17/2019 REFERRING MD:  EDP - [place], CHIEF COMPLAINT:  COVID-19 respiratory failure   Brief History   23 to M with SOB  History of present illness    73 yo M PMH DM HTN who presents to ED with CC productive cough, sob, chest pain with inspiration, fatigue. These symptoms began 5 days prior to presentation and have increased in severity, notably the patient felt too weak to stand. EMS was dispatched for this weakness and SOB, finding the patient to have SpO2 50% upon their arrival. He was transported to ED. In ED patient is hypoxic required 15L NRB + high flow. Patient tested positive for COVID-19 in ED.   ABG: 7.35/36.3/52/23/2/22.3 Na 136 K 4.1 Cl 98 Co2 22 Glu 185 BUN 38, Cr 1.68 BNP 113.6, Trop 34   PCCM asked to see in consultation.   Past Medical History  DM HTN  Significant Hospital Events   12/14> admitted   Consults:  PCCM  Procedures:    Significant Diagnostic Tests:  CXR12/14> Diffuse interstitial and airspace opacities   Micro Data:  12/14 SARS Cov-2> positive   Antimicrobials:    Interim history/subjective:  Patient SpO2 90 on NRB + 15 HF He is comfortable appearing   Objective   Blood pressure 122/65, pulse 94, temperature 97.9 F (36.6 C), temperature source Oral, resp. rate (!) 23, height 6' (1.829 m), weight 127 kg, SpO2 91 %.       No intake or output data in the 24 hours ending 03/03/2019 1310 Filed Weights   03/23/2019 0913  Weight: 127 kg    Examination: General: Acutely ill appearing male, NAD HENT: [place]/AT, PERRL, EOM-I and MMM Lungs: Diffuse crackles Cardiovascular: RRR, Nl S1/S2 and -M/R/G Abdomen: Soft, obese, NT, ND and +BS Extremities: -edema and -tenderness Neuro: Alert and interactive, moving all ext to command Skin: Intact  I reviewed CXR myself, diffuse infiltrate noted  Discussed with  [facility] Problem list   N/A  Assessment & Plan:  73 year old male with PMH above presenting to PCCM with COVID-19 induced ARDS with significant positional desaturation.    - Remdesivir x5 days - Decadron 6 mg IV daily x10 days - Check ESR, CRP, ferritin and IL-6 levels - Heparin SQ Q8 hours - Awake proning - Maintain as dry as able and as renal function allows - Admit to progressive care - If deteriorates from a WOB standpoint or requires BiPAP then transfer to the ICU - IS - Flutter valve - PCCM will continue to follow  Labs   CBC: Recent Labs  Lab 03/23/2019 0922 03/23/2019 1149  WBC 7.0  --   NEUTROABS 6.2  --   HGB 15.2 13.9  HCT 44.0 41.0  MCV 91.5  --   PLT 207  --     Basic Metabolic Panel: Recent Labs  Lab 03/16/2019 0922 03/29/2019 1149  NA 136 133*  K 4.1 3.8  CL 98  --   CO2 22  --   GLUCOSE 185*  --   BUN 38*  --   CREATININE 1.68*  --   CALCIUM 8.3*  --    GFR: Estimated Creatinine Clearance: 53.1 mL/min (A) (by C-G formula based on SCr of 1.68 mg/dL (H)). Recent Labs  Lab 03/30/2019 0922 03/24/2019 0923  WBC 7.0  --   LATICACIDVEN  --  2.6*  Liver Function Tests: Recent Labs  Lab 03/14/2019 0922  AST 63*  ALT 21  ALKPHOS 77  BILITOT 1.1  PROT 6.2*  ALBUMIN 2.7*   No results for input(s): LIPASE, AMYLASE in the last 168 hours. No results for input(s): AMMONIA in the last 168 hours.  ABG    Component Value Date/Time   PHART 7.395 03/24/2019 1149   PCO2ART 36.3 03/30/2019 1149   PO2ART 52.0 (L) 03/25/2019 1149   HCO3 22.3 03/25/2019 1149   TCO2 23 03/31/2019 1149   ACIDBASEDEF 2.0 03/14/2019 1149   O2SAT 87.0 03/14/2019 1149     Coagulation Profile: No results for input(s): INR, PROTIME in the last 168 hours.  Cardiac Enzymes: No results for input(s): CKTOTAL, CKMB, CKMBINDEX, TROPONINI in the last 168 hours.  HbA1C: No results found for: HGBA1C  CBG: No results for input(s): GLUCAP in the last 168  hours.  Review of Systems:   12 point ROS is negative other than above  Past Medical History  He,  has a past medical history of Diabetes mellitus without complication ([place]) and Hypertension.   Surgical History   History reviewed. No pertinent surgical history.   Social History   reports that he has never smoked. He has never used smokeless tobacco. He reports that he does not drink alcohol or use drugs.   Family History   His family history is not on file.   Allergies Not on File   Home Medications  Prior to Admission medications   Medication Sig Start Date End Date Taking? Authorizing Provider  amLODipine-benazepril (LOTREL) 10-20 MG capsule Take 1 capsule by mouth daily. 01/22/19  Yes [provider]  chlorthalidone (HYGROTON) 25 MG tablet Take 25 mg by mouth daily. 01/22/19  Yes [provider]  gemfibrozil (LOPID) 600 MG tablet Take 600 mg by mouth 2 (two) times daily. 01/22/19  Yes [provider]  GLIPIZIDE XL 10 MG 24 hr tablet Take 10 mg by mouth daily. 10/03/18  Yes [provider]  pioglitazone (ACTOS) 30 MG tablet Take 30 mg by mouth daily. 01/22/19  Yes [provider]    Rush Farmer, M.D. [facility] Pulmonary/Critical Care Medicine.

## 2019-03-15 NOTE — ED Notes (Signed)
Patient presents to ed via ems states they were called out to his house today for lift assist. States he was trying to get out of his chair and slid down, wasn't able to get up on his own. When ems arrived patient was sob. States he hadn't felt well for several days c/o productive cough brown colored sputum. Afebrile, states his sats were 52 RA  Placed patient on nrb at 15 liters sats increased 82 %. Upon arrival patient is alert oriented., c/o chest pain with inspiration.

## 2019-03-15 NOTE — ED Notes (Signed)
Patient placed on hospital bed for comfort , states he feels much better and is actually able to breath better.

## 2019-03-15 NOTE — Progress Notes (Signed)
ABG drawn due to SATs dropping into 80s on 100% NRB + 15L HFNC. Blood gas results shown below. ED physician has consulted PCCM. COVID rapid test still pending. Pt is on airborne and contact precautions. MD notified of moderate hypoxemia results. [place] set up and on standby. RT increased the NRB from 15L to flush on flow meter. Pt appears and states he is comfortable at this time with a HR 94, SATs 92% and RR 24. Fine crackles heard in LLL. Clear diminished throughout RL and LUL. RT will continue to monitor pt for distress.  Results for David Robinson, David Robinson (MRN [id number]) as of Apr 05, 2019 12:20  Ref. Range Apr 05, 2019 11:49  Sample type Unknown ARTERIAL  pH, Arterial Latest Ref Range: 7.350 - 7.450  7.395  pCO2 arterial Latest Ref Range: 32.0 - 48.0 mmHg 36.3  pO2, Arterial Latest Ref Range: 83.0 - 108.0 mmHg 52.0 (L)  TCO2 Latest Ref Range: 22 - 32 mmol/L 23  Acid-base deficit Latest Ref Range: 0.0 - 2.0 mmol/L 2.0  Bicarbonate Latest Ref Range: 20.0 - 28.0 mmol/L 22.3  O2 Saturation Latest Units: % 87.0  Patient temperature Unknown 97.9 F  Sodium Latest Ref Range: 135 - 145 mmol/L 133 (L)  Potassium Latest Ref Range: 3.5 - 5.1 mmol/L 3.8  Calcium Ionized Latest Ref Range: 1.15 - 1.40 mmol/L 1.16  Hemoglobin Latest Ref Range: 13.0 - 17.0 g/dL 13.9  HCT Latest Ref Range: 39.0 - 52.0 % 41.0

## 2019-03-15 NOTE — Progress Notes (Signed)
Per PCCM, pt is okay to transport via carelink with 100% NRB to maintain a SAT goal of >80% during transport.   Trial performed with 100% NRB, pt's SATs are maintaining 86-90%. Pt appears comfortable at this time.

## 2019-03-15 NOTE — Progress Notes (Signed)
ANTICOAGULATION CONSULT NOTE - Initial Consult  Pharmacy Consult for Lovenox Indication: pulmonary embolus  Not on File  Patient Measurements: Height: 6' (182.9 cm) Weight: 280 lb (127 kg) IBW/kg (Calculated) : 77.6   Vital Signs: Temp: 98.8 F (37.1 C) (12/14 1830) Temp Source: Oral (12/14 1830) BP: 106/59 (12/14 1830) Pulse Rate: 97 (12/14 1830)  Labs: Recent Labs    03-16-19 0922 03-16-2019 1116 2019-03-16 1149  HGB 15.2  --  13.9  HCT 44.0  --  41.0  PLT 207  --   --   CREATININE 1.68*  --   --   TROPONINIHS 34* 40*  --     Estimated Creatinine Clearance: 53.1 mL/min (A) (by C-G formula based on SCr of 1.68 mg/dL (H)).   Medical History: Past Medical History:  Diagnosis Date  . Diabetes mellitus without complication ([place])   . Hypertension     Medications:  Scheduled:  . sodium chloride   Intravenous Once  . acetaminophen  650 mg Oral Once  . albuterol  2 puff Inhalation Q6H  . vitamin C  500 mg Oral Daily  . diphenhydrAMINE  25 mg Intravenous Once  . enoxaparin (LOVENOX) injection  1 mg/kg Subcutaneous Q12H  . furosemide  20 mg Intravenous Once  . insulin aspart  0-15 Units Subcutaneous TID WC  . insulin aspart  0-5 Units Subcutaneous QHS  . insulin glargine  15 Units Subcutaneous QHS  . methylPREDNISolone (SOLU-MEDROL) injection  60 mg Intravenous Q12H  . sodium chloride flush  3 mL Intravenous Q12H  . sodium chloride flush  3 mL Intravenous Q12H  . zinc sulfate  220 mg Oral Daily   Infusions:  . sodium chloride    . [START ON 03/16/2019] remdesivir 100 mg in NS 100 mL    . tocilizumab (ACTEMRA) IV      Assessment: 73 y/o male with a PMH significant for HTN and DM, presenting to [facility] with SOB on 12/14, has now been transferred to [facility] ICU. Pharmacy has been consulted to start therapeutic Lovenox d/t concern for PE with Ddimer >20. Patient's Scr is 1.68 and he is thought to have AKI. Hemoglobin and platelets are WNL.  No signs or symptoms of bleeding  noted.    Goal of Therapy:  Anticoagulation Monitor platelets by anticoagulation protocol: Yes   Plan:  Lovenox 1mg /kg (125 mg) SQ q12hr F/u lower extremity doppler and CT angiogram tomorrow if renal function improves per Dr. Sloan Leiter Monitor s/sx of bleeding, renal function  Agnes Lawrence, PharmD PGY1 Pharmacy Resident

## 2019-03-15 NOTE — ED Notes (Signed)
ED TO INPATIENT HANDOFF REPORT  ED Nurse Name and Phone #: [phone]  S Name/Age/Gender David Robinson 73 y.o. male Room/Bed: 032C/032C  Code Status   Code Status: Not on file  Home/SNF/Other Home Patient oriented to: self, place, time and situation Is this baseline? Yes   Triage Complete: Triage complete  Chief Complaint Acute respiratory failure with hypoxia ([place]) [J96.01]  Triage Note No notes on file   Allergies Not on File  Level of Care/Admitting Diagnosis ED Disposition    ED Disposition Condition [place]: [facility] [100101]  Level of Care: ICU [6]  Covid Evaluation: Confirmed COVID Positive  Diagnosis: Acute respiratory failure with hypoxia [facility]) [[id number]]  Admitting Physician: Karmen Bongo [[id number]]  Attending Physician: Karmen Bongo [[id number]]  Estimated length of stay: 3 - 4 days  Certification:: I certify this patient will need inpatient services for at least 2 midnights       B Medical/Surgery History Past Medical History:  Diagnosis Date  . Diabetes mellitus without complication ([place])   . Hypertension    History reviewed. No pertinent surgical history.   A IV Location/Drains/Wounds Patient Lines/Drains/Airways Status   Active Line/Drains/Airways    Name:   Placement date:   Placement time:   Site:   Days:   Peripheral IV 03/29/2019 Left Antecubital   04/01/2019    0918    Antecubital   less than 1   Peripheral IV 03/25/2019 Right Antecubital   03/25/2019    0919    Antecubital   less than 1          Intake/Output Last 24 hours No intake or output data in the 24 hours ending 03/05/2019 1358  Labs/Imaging Results for orders placed or performed during the hospital encounter of 03/31/2019 (from the past 48 hour(s))  CBC with Differential     Status: Abnormal   Collection Time: 03/28/2019  9:22 AM  Result Value Ref Range   WBC 7.0 4.0 - 10.5 K/uL   RBC 4.81 4.22 - 5.81 MIL/uL   Hemoglobin 15.2 13.0 - 17.0  g/dL   HCT 44.0 39.0 - 52.0 %   MCV 91.5 80.0 - 100.0 fL   MCH 31.6 26.0 - 34.0 pg   MCHC 34.5 30.0 - 36.0 g/dL   RDW 14.3 11.5 - 15.5 %   Platelets 207 150 - 400 K/uL   nRBC 0.0 0.0 - 0.2 %   Neutrophils Relative % 88 %   Neutro Abs 6.2 1.7 - 7.7 K/uL   Lymphocytes Relative 7 %   Lymphs Abs 0.5 (L) 0.7 - 4.0 K/uL   Monocytes Relative 4 %   Monocytes Absolute 0.3 0.1 - 1.0 K/uL   Eosinophils Relative 0 %   Eosinophils Absolute 0.0 0.0 - 0.5 K/uL   Basophils Relative 0 %   Basophils Absolute 0.0 0.0 - 0.1 K/uL   WBC Morphology >10% Reactive Benign Lymphoctyes    Immature Granulocytes 1 %   Abs Immature Granulocytes 0.05 0.00 - 0.07 K/uL    Comment: Performed at [facility], [address], [place], [place] [address]  Comprehensive metabolic panel     Status: Abnormal   Collection Time: 04/01/2019  9:22 AM  Result Value Ref Range   Sodium 136 135 - 145 mmol/L   Potassium 4.1 3.5 - 5.1 mmol/L   Chloride 98 98 - 111 mmol/L   CO2 22 22 - 32 mmol/L   Glucose, Bld 185 (H)  70 - 99 mg/dL   BUN 38 (H) 8 - 23 mg/dL   Creatinine, Ser 1.68 (H) 0.61 - 1.24 mg/dL   Calcium 8.3 (L) 8.9 - 10.3 mg/dL   Total Protein 6.2 (L) 6.5 - 8.1 g/dL   Albumin 2.7 (L) 3.5 - 5.0 g/dL   AST 63 (H) 15 - 41 U/L   ALT 21 0 - 44 U/L   Alkaline Phosphatase 77 38 - 126 U/L   Total Bilirubin 1.1 0.3 - 1.2 mg/dL   GFR calc non Af Amer 39 (L) >60 mL/min   GFR calc Af Amer 46 (L) >60 mL/min   Anion gap 16 (H) 5 - 15    Comment: Performed at [facility], [place] [address], [place], [place] [address]  Troponin I (High Sensitivity)     Status: Abnormal   Collection Time: 03/03/2019  9:22 AM  Result Value Ref Range   Troponin I (High Sensitivity) 34 (H) <18 ng/L    Comment: (NOTE) Elevated high sensitivity troponin I (hsTnI) values and significant  changes across serial measurements may suggest ACS but many other  chronic and acute conditions are known to elevate hsTnI results.  Refer to the "Links"  section for chest pain algorithms and additional  guidance. Performed at [facility], [place] [address], [place], [place] [address]   Brain natriuretic peptide     Status: Abnormal   Collection Time: 03/05/2019  9:22 AM  Result Value Ref Range   B Natriuretic Peptide 113.6 (H) 0.0 - 100.0 pg/mL    Comment: Performed at [facility] [address], [place], [place] [address]  Lactic acid, plasma     Status: Abnormal   Collection Time: 03/21/2019  9:23 AM  Result Value Ref Range   Lactic Acid, Venous 2.6 (HH) 0.5 - 1.9 mmol/L    Comment: CRITICAL RESULT CALLED TO, READ BACK BY AND VERIFIED WITH: Talbert Forest RN [phone] BY A BENNETT Performed at [facility], [place] [address] [place], [place] [address]   POC SARS Coronavirus 2 Ag-ED - Nasal Swab (BD Veritor Kit)     Status: None   Collection Time: 03/03/2019 10:08 AM  Result Value Ref Range   SARS Coronavirus 2 Ag NEGATIVE NEGATIVE    Comment: (NOTE) SARS-CoV-2 antigen NOT DETECTED.  Negative results are presumptive.  Negative results do not preclude SARS-CoV-2 infection and should not be used as the sole basis for treatment or other patient management decisions, including infection  control decisions, particularly in the presence of clinical signs and  symptoms consistent with COVID-19, or in those who have been in contact with the virus.  Negative results must be combined with clinical observations, patient history, and epidemiological information. The expected result is Negative. Fact Sheet for Patients: PodPark.tn Fact Sheet for Healthcare Providers: GiftContent.is This test is not yet approved or cleared by the [place] FDA and  has been authorized for detection and/or diagnosis of SARS-CoV-2 by FDA under an Emergency Use Authorization (EUA).  This EUA will remain in effect (meaning this test can be used) for the duration of  the COVID-19 de claration  under Section 564(b)(1) of the Act, 21 U.S.C. section 360bbb-3(b)(1), unless the authorization is terminated or revoked sooner.   Respiratory Panel by RT PCR (Flu A&B, Covid) - Nasopharyngeal Swab     Status: Abnormal   Collection Time: 03/17/2019 10:52 AM   Specimen: Nasopharyngeal Swab  Result Value Ref Range   SARS  Coronavirus 2 by RT PCR POSITIVE (A) NEGATIVE    Comment: RESULT CALLED TO, READ BACK BY AND VERIFIED WITH: Revonda Humphrey RN 12:40 03/25/2019 ([email or web address]) (NOTE) SARS-CoV-2 target nucleic acids are DETECTED. SARS-CoV-2 RNA is generally detectable in upper respiratory specimens  during the acute phase of infection. Positive results are indicative of the presence of the identified virus, but do not rule out bacterial infection or co-infection with other pathogens not detected by the test. Clinical correlation with patient history and other diagnostic information is necessary to determine patient infection status. The expected result is Negative. Fact Sheet for Patients:  PinkCheek.be Fact Sheet for Healthcare Providers: GravelBags.it This test is not yet approved or cleared by the [place] FDA and  has been authorized for detection and/or diagnosis of SARS-CoV-2 by FDA under an Emergency Use Authorization (EUA).  This EUA will remain in effect (meaning this test can be used)  for the duration of  the COVID-19 declaration under Section 564(b)(1) of the Act, 21 U.S.C. section 360bbb-3(b)(1), unless the authorization is terminated or revoked sooner.    Influenza A by PCR NEGATIVE NEGATIVE   Influenza B by PCR NEGATIVE NEGATIVE    Comment: (NOTE) The Xpert Xpress SARS-CoV-2/FLU/RSV assay is intended as an aid in  the diagnosis of influenza from Nasopharyngeal swab specimens and  should not be used as a sole basis for treatment. Nasal washings and  aspirates are unacceptable for Xpert Xpress SARS-CoV-2/FLU/RSV   testing. Fact Sheet for Patients: PinkCheek.be Fact Sheet for Healthcare Providers: GravelBags.it This test is not yet approved or cleared by the [place] FDA and  has been authorized for detection and/or diagnosis of SARS-CoV-2 by  FDA under an Emergency Use Authorization (EUA). This EUA will remain  in effect (meaning this test can be used) for the duration of the  Covid-19 declaration under Section 564(b)(1) of the Act, 21  U.S.C. section 360bbb-3(b)(1), unless the authorization is  terminated or revoked. Performed at [facility], [place] [address] [place], [place] [address]   Troponin I (High Sensitivity)     Status: Abnormal   Collection Time: 03/18/2019 11:16 AM  Result Value Ref Range   Troponin I (High Sensitivity) 40 (H) <18 ng/L    Comment: (NOTE) Elevated high sensitivity troponin I (hsTnI) values and significant  changes across serial measurements may suggest ACS but many other  chronic and acute conditions are known to elevate hsTnI results.  Refer to the "Links" section for chest pain algorithms and additional  guidance. Performed at [facility], [place] [address] [place], [place] [address]   I-STAT 7, (LYTES, BLD GAS, ICA, H+H)     Status: Abnormal   Collection Time: 03/26/2019 11:49 AM  Result Value Ref Range   pH, Arterial 7.395 7.350 - 7.450   pCO2 arterial 36.3 32.0 - 48.0 mmHg   pO2, Arterial 52.0 (L) 83.0 - 108.0 mmHg   Bicarbonate 22.3 20.0 - 28.0 mmol/L   TCO2 23 22 - 32 mmol/L   O2 Saturation 87.0 %   Acid-base deficit 2.0 0.0 - 2.0 mmol/L   Sodium 133 (L) 135 - 145 mmol/L   Potassium 3.8 3.5 - 5.1 mmol/L   Calcium, Ion 1.16 1.15 - 1.40 mmol/L   HCT 41.0 39.0 - 52.0 %   Hemoglobin 13.9 13.0 - 17.0 g/dL   Patient temperature 97.9 F    Sample type ARTERIAL    DG Chest Portable 1 View  Result Date: 03/10/2019 CLINICAL DATA:  Shortness  of breath EXAM: PORTABLE CHEST 1 VIEW  COMPARISON:  12/19/2015 FINDINGS: Lung volumes are low with diffuse interstitial and airspace opacities. Cardiomediastinal contours are likely normal accounting for low depth of inspiration and portable technique. No signs of lobar consolidation or visible effusion. IMPRESSION: Diffuse interstitial and airspace opacities may represent pneumonia or edema. Electronically Signed   By: Zetta Bills M.D.   On: 03/18/2019 09:47    Pending Labs Unresulted Labs (From admission, onward)    Start     Ordered   03/16/19 0500  CBC with Differential/Platelet  Daily,   R     03/26/2019 1325   03/16/19 0500  Comprehensive metabolic panel  Daily,   R     03/24/2019 1325   03/16/19 0500  C-reactive protein  Daily,   R     03/27/2019 1325   03/16/19 0500  D-dimer, quantitative (not at [facility])  Daily,   R     03/13/2019 1325   03/27/2019 1320  Interleukin-6, Serum  Once,   STAT     03/08/2019 1325   03/25/2019 1319  Ferritin  Once,   STAT     03/11/2019 1325   03/14/2019 1319  D-dimer, quantitative (not at [facility])  Once,   STAT     03/14/2019 1325   03/25/2019 1319  Procalcitonin  Once,   STAT     03/10/2019 1325   03/26/2019 1319  Culture, blood (Routine X 2) w Reflex to ID Panel  BLOOD CULTURE X 2,   R (with STAT occurrences)     03/23/2019 1325   03/30/2019 1319  Culture, sputum-assessment  Once,   R     03/29/2019 1325   03/29/2019 1015  Lactic acid, plasma  Now then every 2 hours,   STAT     03/30/2019 1014   03/14/2019 1015  Blood culture (routine x 2)  BLOOD CULTURE X 2,   STAT     03/05/2019 1014          Vitals/Pain Today's Vitals   03/16/2019 0913 03/21/2019 0925 03/08/2019 1054 03/25/2019 1201  BP:  (!) 99/53 (!) 121/58 122/65  Pulse:  96 94 94  Resp:  20 (!) 28 (!) 23  Temp:      TempSrc:      SpO2:  93% 91% 91%  Weight: 127 kg     Height: 6' (1.829 m)     PainSc: 0-No pain 0-No pain      Isolation Precautions Airborne and Contact precautions  Medications Medications  aspirin chewable tablet 324 mg (has no  administration in time range)  dexamethasone (DECADRON) injection 6 mg (has no administration in time range)  remdesivir 200 mg in sodium chloride 0.9% 250 mL IVPB (has no administration in time range)    Followed by  remdesivir 100 mg in sodium chloride 0.9 % 100 mL IVPB (has no administration in time range)  albuterol (VENTOLIN HFA) 108 (90 Base) MCG/ACT inhaler 2 puff (2 puffs Inhalation Given 03/12/2019 1032)  cefTRIAXone (ROCEPHIN) 1 g in sodium chloride 0.9 % 100 mL IVPB (0 g Intravenous Stopped 03/26/2019 1337)  azithromycin (ZITHROMAX) 500 mg in sodium chloride 0.9 % 250 mL IVPB (0 mg Intravenous Stopped 03/14/2019 1248)  sodium chloride 0.9 % bolus 500 mL (500 mLs Intravenous New Bag/Given 03/27/2019 1338)    Mobility walks High fall risk   Focused Assessments    R Recommendations: See Admitting Provider Note  Report given to:   Additional Notes:

## 2019-03-15 NOTE — ED Provider Notes (Signed)
[facility] EMERGENCY DEPARTMENT Provider Note   CSN: [id number] Arrival date & time: 03/26/19  6578     History Chief Complaint  Patient presents with  . Shortness of Breath    David Robinson is a 73 y.o. male.  HPI   73 year old male with history of diabetes, hypertension, presenting to the emergency department today for evaluation of shortness of breath.  EMS was called out to help the patient transfer as he was too weak to stand up.  On their arrival he was noted to have sats in the 50s and was too weak to stand.  He was complaining of chest pain at that point however he was then placed on 15 L nonrebreather and sats improved to the 80s and his chest pain resolved.  At present he denies any chest pain or pleuritic pain.  He does state that he has had a cough with brown sputum production for the last 5 days and has felt more short of breath.  He denies any leg swelling.  He does report that he had a vascular surgery about a month ago.  He denies a history of tobacco use, COPD or CHF.  Past Medical History:  Diagnosis Date  . Diabetes mellitus without complication (H[facility])   . Hypertension     There are no problems to display for this patient.   History reviewed. No pertinent surgical history.     No family history on file.  Social History   Tobacco Use  . Smoking status: Never Smoker  . Smokeless tobacco: Never Used  Substance Use Topics  . Alcohol use: Never  . Drug use: Never    Home Medications Prior to Admission medications   Medication Sig Start Date End Date Taking? Authorizing Provider  amLODipine-benazepril (LOTREL) 10-20 MG capsule Take 1 capsule by mouth daily. 01/22/19  Yes [provider]  chlorthalidone (HYGROTON) 25 MG tablet Take 25 mg by mouth daily. 01/22/19  Yes [provider]  gemfibrozil (LOPID) 600 MG tablet Take 600 mg by mouth 2 (two) times daily. 01/22/19  Yes [provider]  GLIPIZIDE XL 10 MG  24 hr tablet Take 10 mg by mouth daily. 10/03/18  Yes [provider]  pioglitazone (ACTOS) 30 MG tablet Take 30 mg by mouth daily. 01/22/19  Yes [provider]    Allergies    Patient has no allergy information on record.  Review of Systems   Review of Systems  Constitutional: Negative for fever.  HENT: Negative for ear pain and sore throat.   Eyes: Negative for visual disturbance.  Respiratory: Positive for cough and shortness of breath.   Cardiovascular: Positive for chest pain.  Gastrointestinal: Negative for abdominal pain, constipation, diarrhea, nausea and vomiting.  Genitourinary: Negative for dysuria and hematuria.  Musculoskeletal: Negative for back pain.  Skin: Negative for color change and rash.  Neurological: Negative for headaches.  All other systems reviewed and are negative.   Physical Exam Updated Vital Signs BP 122/65   Pulse 94   Temp 97.9 F (36.6 C) (Oral)   Resp (!) 23   Ht 6' (1.829 m)   Wt 127 kg   SpO2 91%   BMI 37.97 kg/m   Physical Exam Vitals and nursing note reviewed.  Constitutional:      Appearance: He is well-developed.  HENT:     Head: Normocephalic and atraumatic.  Eyes:     Conjunctiva/sclera: Conjunctivae normal.  Cardiovascular:     Rate and Rhythm:  Normal rate and regular rhythm.     Heart sounds: No murmur.  Pulmonary:     Effort: Tachypnea present.     Breath sounds: Examination of the right-lower field reveals rales. Examination of the left-lower field reveals rales. Decreased breath sounds and rales present.  Abdominal:     Palpations: Abdomen is soft.     Tenderness: There is no abdominal tenderness.  Musculoskeletal:     Cervical back: Neck supple.     Comments: Trace ble edema  Skin:    General: Skin is warm and dry.  Neurological:     Mental Status: He is alert.     ED Results / Procedures / Treatments   Labs (all labs ordered are listed, but only abnormal results are displayed) Labs  Reviewed  RESPIRATORY PANEL BY RT PCR (FLU A&B, COVID) - Abnormal; Notable for the following components:      Result Value   SARS Coronavirus 2 by RT PCR POSITIVE (*)    All other components within normal limits  CBC WITH DIFFERENTIAL/PLATELET - Abnormal; Notable for the following components:   Lymphs Abs 0.5 (*)    All other components within normal limits  COMPREHENSIVE METABOLIC PANEL - Abnormal; Notable for the following components:   Glucose, Bld 185 (*)    BUN 38 (*)    Creatinine, Ser 1.68 (*)    Calcium 8.3 (*)    Total Protein 6.2 (*)    Albumin 2.7 (*)    AST 63 (*)    GFR calc non Af Amer 39 (*)    GFR calc Af Amer 46 (*)    Anion gap 16 (*)    All other components within normal limits  BRAIN NATRIURETIC PEPTIDE - Abnormal; Notable for the following components:   B Natriuretic Peptide 113.6 (*)    All other components within normal limits  LACTIC ACID, PLASMA - Abnormal; Notable for the following components:   Lactic Acid, Venous 2.6 (*)    All other components within normal limits  POCT I-STAT 7, (LYTES, BLD GAS, ICA,H+H) - Abnormal; Notable for the following components:   pO2, Arterial 52.0 (*)    Sodium 133 (*)    All other components within normal limits  TROPONIN I (HIGH SENSITIVITY) - Abnormal; Notable for the following components:   Troponin I (High Sensitivity) 34 (*)    All other components within normal limits  TROPONIN I (HIGH SENSITIVITY) - Abnormal; Notable for the following components:   Troponin I (High Sensitivity) 40 (*)    All other components within normal limits  CULTURE, BLOOD (ROUTINE X 2)  CULTURE, BLOOD (ROUTINE X 2)  CULTURE, BLOOD (ROUTINE X 2)  CULTURE, BLOOD (ROUTINE X 2)  EXPECTORATED SPUTUM ASSESSMENT W REFEX TO RESP CULTURE  LACTIC ACID, PLASMA  FERRITIN  D-DIMER, QUANTITATIVE (NOT AT [facility])  PROCALCITONIN  INTERLEUKIN-6, SERUM  IL6SL  POC SARS CORONAVIRUS 2 AG -  ED    EKG EKG Interpretation  Date/Time:  Monday March 15 2019 09:22:32 EST Ventricular Rate:  96 PR Interval:    QRS Duration: 150 QT Interval:  372 QTC Calculation: 471 R Axis:   -72 Text Interpretation: Sinus rhythm Prolonged PR interval RBBB and LAFB Probable left ventricular hypertrophy Confirmed by Benjiman Core [phone]) on 03-30-2019 10:11:59 AM   Radiology DG Chest Portable 1 View  Result Date: 2019-03-30 CLINICAL DATA:  Shortness of breath EXAM: PORTABLE CHEST 1 VIEW COMPARISON:  12/19/2015 FINDINGS: Lung volumes are low with diffuse interstitial and  airspace opacities. Cardiomediastinal contours are likely normal accounting for low depth of inspiration and portable technique. No signs of lobar consolidation or visible effusion. IMPRESSION: Diffuse interstitial and airspace opacities may represent pneumonia or edema. Electronically Signed   By: Donzetta KohutGeoffrey  Wile M.D.   On: October 23, 2018 09:47    Procedures Procedures (including critical care time) CRITICAL CARE Performed by: Karrie Meresortni S [place]   Total critical care time: 40 minutes  Critical care time was exclusive of separately billable procedures and treating other patients.  Critical care was necessary to treat or prevent imminent or life-threatening deterioration.  Critical care was time spent personally by me on the following activities: development of treatment plan with patient and/or surrogate as well as nursing, discussions with consultants, evaluation of patient's response to treatment, examination of patient, obtaining history from patient or surrogate, ordering and performing treatments and interventions, ordering and review of laboratory studies, ordering and review of radiographic studies, pulse oximetry and re-evaluation of patient's condition.   Medications Ordered in ED Medications  sodium chloride 0.9 % bolus 500 mL (has no administration in time range)  aspirin chewable tablet 324 mg (has no administration in time range)  dexamethasone (DECADRON) injection 6 mg  (has no administration in time range)  remdesivir 200 mg in sodium chloride 0.9% 250 mL IVPB (has no administration in time range)    Followed by  remdesivir 100 mg in sodium chloride 0.9 % 100 mL IVPB (has no administration in time range)  albuterol (VENTOLIN HFA) 108 (90 Base) MCG/ACT inhaler 2 puff (2 puffs Inhalation Given 07/27/18 1032)  cefTRIAXone (ROCEPHIN) 1 g in sodium chloride 0.9 % 100 mL IVPB (1 g Intravenous New Bag/Given 07/27/18 1251)  azithromycin (ZITHROMAX) 500 mg in sodium chloride 0.9 % 250 mL IVPB (0 mg Intravenous Stopped 07/27/18 1248)    ED Course  I have reviewed the triage vital signs and the nursing notes.  Pertinent labs & imaging results that were available during my care of the patient were reviewed by me and considered in my medical decision making (see chart for details).    MDM Rules/Calculators/A&P      73 y/o male presenting for eval of sob and cough that started 4 days ago. No known fevers at home and no known covid exposures. No known h/o CHF or COPD.   Pt with sats in the 50s pta, increased to 90% on 15L NRB.   CBC wnl Rapid COVID negative CXR with diffuse interstitial and airspace opacities may represent pneumonia or edema. Sinus rhythm Prolonged PR interval RBBB and LAFB Probable left ventricular hypertrophy   10:30 AM. Pt still satting in 90s. Reviewed cxr, concerning for edema vs infiltrated. Pt with normal wbc count, no fevers, negative covid, will order bipap and abx for cap. bp's soft. Small fluid bolus ordered.   11:30 AM Respiratory will not complete bipap until negative covid pcr results. Pt initiated on high flow and [place]. sats in the high 80s- low 90s. BP stable.   CMP with elevated BUN/Cr at 38/1.68. marginally elevated at 63. Elevated anion gap at 16, normal bicarb Trop marginally elevated, likely demand from severe hypoxia BNP marginally elevated  Lactic acid elevated at 2.6 Blood cultures obtained.   Pt with acute hypoxic  respiratory failure, have increased suspicion for COVID as primary cause but this could be multifactorial   11:58 AM CONSULT with Dr. Molli KnockYacoub with critical care. He also spoke with my attending Dr. Rubin PayorPickering. He will see the patient in the ED.  12:22 PM Dr. Nelda Marseille at bedside to eval pt. He states crit care will consult but that he believes pt is appropriate for hospitalist admit.   1:29 PM CONSULT with Dr. Lorin Mercy who accepts patient for admission.   Pt seen in conjunction with Dr Alvino Chapel who personally evaluated the patient and assisted in patients workup and care.   Final Clinical Impression(s) / ED Diagnoses Final diagnoses:  Acute respiratory failure with hypoxia [facility])    Rx / DC Orders ED Discharge Orders    None       Bishop Dublin 2019/04/10 1330    Davonna Belling, MD Apr 10, 2019 1537

## 2019-03-15 NOTE — Progress Notes (Signed)
Unable to reach wife, Jeoffrey Eleazer to give updates on the status of Mr. Hewes. No new changes at this time.

## 2019-03-15 NOTE — Progress Notes (Signed)
Patient seen and examined He appears comfortable-but just about able to complete 3-4 word sentences-slightly dyspneic then.  Currently on both heated high flow and 100% NRB.  COVID-19 Labs  Recent Labs    Apr 03, 2019 1513  DDIMER >20.00*  FERRITIN 595*  LDH 694*  CRP 22.2*    Lab Results  Component Value Date   SARSCOV2NAA POSITIVE (A) 04-03-2019    I had a long discussion with the patient regarding the off label use of Actemra-its rationale, benefits and side effects were discussed in detail.  I also had a long discussion regarding use of convalescent plasma.  After extensive discussion-patient consents to the use of both of these agents.  Patient's D-dimer is significantly elevated-as elevated-hold off on CT chest due to AKI-we will go ahead and put him on therapeutic Lovenox, and get lower extremity Doppler and echocardiogram.  Repeat labs tomorrow-if renal function improves-we can contemplate getting a CT angiogram of his chest.  Procalcitonin is negative-do not think patient requires antibiotics-we will go ahead and discontinue both Rocephin and Zithromax.  Anticipate significant amount of hyperglycemia due to steroid use-we will add Lantus 15 units nightly, continue SSI and follow.  The rationale for the off label use of Actemra its known side effects, potential benefits was discussed with patient..The use of Actemra is based on published clinical articles/anecdotal dataas several randomized trials have been negative, but lately there have been a few positive randomized trials as well. There is no history of tuberculosis, hepatitis B or C. Complete risks and long-term side effects are unknown, however in the best clinical judgment it is felt that the clinical benefit at this time outweighs medical risks given tenuous clinical state of the patient. Patientagree's with the treatment plan and consent to the use of Actemra.  Total time spent: Equals 30 minutes  Time in:  6:25 PM Time out: 6:55 PM

## 2019-03-15 NOTE — Plan of Care (Signed)
Acknowledge risk factors for covid

## 2019-03-16 ENCOUNTER — Other Ambulatory Visit: Payer: Self-pay

## 2019-03-16 ENCOUNTER — Inpatient Hospital Stay (HOSPITAL_COMMUNITY): Payer: Medicare Other

## 2019-03-16 ENCOUNTER — Encounter (HOSPITAL_COMMUNITY): Payer: Self-pay | Admitting: Internal Medicine

## 2019-03-16 DIAGNOSIS — R0602 Shortness of breath: Secondary | ICD-10-CM

## 2019-03-16 DIAGNOSIS — E118 Type 2 diabetes mellitus with unspecified complications: Secondary | ICD-10-CM | POA: Diagnosis present

## 2019-03-16 DIAGNOSIS — U071 COVID-19: Secondary | ICD-10-CM | POA: Diagnosis present

## 2019-03-16 DIAGNOSIS — E669 Obesity, unspecified: Secondary | ICD-10-CM | POA: Diagnosis present

## 2019-03-16 DIAGNOSIS — E662 Morbid (severe) obesity with alveolar hypoventilation: Secondary | ICD-10-CM

## 2019-03-16 DIAGNOSIS — Z6830 Body mass index (BMI) 30.0-30.9, adult: Secondary | ICD-10-CM

## 2019-03-16 DIAGNOSIS — J1282 Pneumonia due to coronavirus disease 2019: Secondary | ICD-10-CM | POA: Diagnosis present

## 2019-03-16 LAB — COMPREHENSIVE METABOLIC PANEL
ALT: 23 U/L (ref 0–44)
AST: 68 U/L — ABNORMAL HIGH (ref 15–41)
Albumin: 2.7 g/dL — ABNORMAL LOW (ref 3.5–5.0)
Alkaline Phosphatase: 96 U/L (ref 38–126)
Anion gap: 14 (ref 5–15)
BUN: 35 mg/dL — ABNORMAL HIGH (ref 8–23)
CO2: 23 mmol/L (ref 22–32)
Calcium: 8.6 mg/dL — ABNORMAL LOW (ref 8.9–10.3)
Chloride: 102 mmol/L (ref 98–111)
Creatinine, Ser: 0.92 mg/dL (ref 0.61–1.24)
GFR calc Af Amer: >60 mL/min (ref >60)
GFR calc non Af Amer: >60 mL/min (ref >60)
Glucose, Bld: 234 mg/dL — ABNORMAL HIGH (ref 70–99)
Potassium: 4 mmol/L (ref 3.5–5.1)
Sodium: 139 mmol/L (ref 135–145)
Total Bilirubin: 0.9 mg/dL (ref 0.3–1.2)
Total Protein: 6.7 g/dL (ref 6.5–8.1)

## 2019-03-16 LAB — CBC WITH DIFFERENTIAL/PLATELET
Abs Immature Granulocytes: 0.07 10*3/uL (ref 0.00–0.07)
Basophils Absolute: 0.1 10*3/uL (ref 0.0–0.1)
Basophils Relative: 1 %
Eosinophils Absolute: 0 10*3/uL (ref 0.0–0.5)
Eosinophils Relative: 0 %
HCT: 46.9 % (ref 39.0–52.0)
Hemoglobin: 15.8 g/dL (ref 13.0–17.0)
Immature Granulocytes: 1 %
Lymphocytes Relative: 12 %
Lymphs Abs: 0.7 10*3/uL (ref 0.7–4.0)
MCH: 30.8 pg (ref 26.0–34.0)
MCHC: 33.7 g/dL (ref 30.0–36.0)
MCV: 91.4 fL (ref 80.0–100.0)
Monocytes Absolute: 0.2 10*3/uL (ref 0.1–1.0)
Monocytes Relative: 3 %
Neutro Abs: 5.2 10*3/uL (ref 1.7–7.7)
Neutrophils Relative %: 83 %
Platelets: 201 10*3/uL (ref 150–400)
RBC: 5.13 MIL/uL (ref 4.22–5.81)
RDW: 14.5 % (ref 11.5–15.5)
WBC: 6.2 10*3/uL (ref 4.0–10.5)
nRBC: 0 % (ref 0.0–0.2)

## 2019-03-16 LAB — ECHOCARDIOGRAM COMPLETE
Height: 72 in
Weight: 4480 oz

## 2019-03-16 LAB — POCT I-STAT 7, (LYTES, BLD GAS, ICA,H+H)
Acid-Base Excess: 1 mmol/L (ref 0.0–2.0)
Bicarbonate: 25.6 mmol/L (ref 20.0–28.0)
Calcium, Ion: 1.21 mmol/L (ref 1.15–1.40)
HCT: 42 % (ref 39.0–52.0)
Hemoglobin: 14.3 g/dL (ref 13.0–17.0)
O2 Saturation: 76 %
Patient temperature: 98
Potassium: 3.8 mmol/L (ref 3.5–5.1)
Sodium: 137 mmol/L (ref 135–145)
TCO2: 27 mmol/L (ref 22–32)
pCO2 arterial: 38.7 mmHg (ref 32.0–48.0)
pH, Arterial: 7.427 (ref 7.350–7.450)
pO2, Arterial: 39 mmHg — CL (ref 83.0–108.0)

## 2019-03-16 LAB — GLUCOSE, CAPILLARY
Glucose-Capillary: 207 mg/dL — ABNORMAL HIGH (ref 70–99)
Glucose-Capillary: 266 mg/dL — ABNORMAL HIGH (ref 70–99)
Glucose-Capillary: 263 mg/dL — ABNORMAL HIGH (ref 70–99)

## 2019-03-16 LAB — MRSA PCR SCREENING: MRSA by PCR: NEGATIVE

## 2019-03-16 LAB — D-DIMER, QUANTITATIVE
D-Dimer, Quant: >20 ug/mL-FEU — ABNORMAL HIGH (ref 0.00–0.50)
D-Dimer, Quant: >20 ug/mL-FEU — ABNORMAL HIGH (ref 0.00–0.50)

## 2019-03-16 LAB — C-REACTIVE PROTEIN: CRP: 24.6 mg/dL — ABNORMAL HIGH (ref <1.0)

## 2019-03-16 LAB — FERRITIN: Ferritin: 554 ng/mL — ABNORMAL HIGH (ref 24–336)

## 2019-03-16 MED ORDER — DEXTROSE-NACL 5-0.9 % IV SOLN: INTRAVENOUS | Status: DC

## 2019-03-16 MED ORDER — LORAZEPAM 2 MG/ML IJ SOLN
0.5000 mg | Freq: Once | INTRAMUSCULAR | Status: AC
  Administered 2019-03-16: 0.5 mg via INTRAVENOUS
  Filled 2019-03-16: qty 1

## 2019-03-16 MED ORDER — TOCILIZUMAB 400 MG/20ML IV SOLN
800.0000 mg | Freq: Once | INTRAVENOUS | Status: AC
  Administered 2019-03-16: 800 mg via INTRAVENOUS
  Filled 2019-03-16: qty 40

## 2019-03-16 MED ORDER — DEXAMETHASONE SODIUM PHOSPHATE 10 MG/ML IJ SOLN
6.0000 mg | INTRAMUSCULAR | Status: DC
  Administered 2019-03-16 – 2019-03-19 (×4): 6 mg via INTRAVENOUS
  Filled 2019-03-16 (×4): qty 1

## 2019-03-16 MED ORDER — DEXMEDETOMIDINE HCL IN NACL 400 MCG/100ML IV SOLN
0.0000 ug/kg/h | INTRAVENOUS | Status: DC
  Administered 2019-03-17: 0.1 ug/kg/h via INTRAVENOUS
  Administered 2019-03-17 – 2019-03-19 (×5): 0.3 ug/kg/h via INTRAVENOUS
  Filled 2019-03-16 (×7): qty 100

## 2019-03-16 MED ORDER — FUROSEMIDE 10 MG/ML IJ SOLN
40.0000 mg | Freq: Once | INTRAMUSCULAR | Status: AC
  Administered 2019-03-16: 40 mg via INTRAVENOUS
  Filled 2019-03-16: qty 4

## 2019-03-16 MED ORDER — IPRATROPIUM-ALBUTEROL 20-100 MCG/ACT IN AERS
1.0000 | INHALATION_SPRAY | Freq: Four times a day (QID) | RESPIRATORY_TRACT | Status: DC
  Administered 2019-03-16 – 2019-03-20 (×18): 1 via RESPIRATORY_TRACT
  Filled 2019-03-16: qty 4

## 2019-03-16 NOTE — Progress Notes (Signed)
  Echocardiogram 2D Echocardiogram has been performed.  Matilde Bash 03/16/2019, 9:44 AM

## 2019-03-16 NOTE — Progress Notes (Addendum)
PROGRESS NOTE    David Robinson [facility]  ZOX:[id number] DOB: 03-15-1946 DOA: 03/14/2019 PCP: Burton Apley, MD   Brief Narrative:  David Robinson is a 73 y.o. WM PMHx HTN and diabetes type 2 controlled with complication, morbidly obese, presenting with SOB.    He reports getting sick about a week ago, with SOB developing on Friday.  He was reluctant to come to the ER, but today was too weak to lift himself out of a chair and he slid to the floor.  +cough, nonproductive. No fever.  No GI symptoms other than decreased PO intake.  I spoke with his wife.  He has been sick for several days and became SOB Friday.  Increasing weakness, and this AM he was unable to hold himself up.  He has been consented for Actemra should he become intubated and need it.   ED Course:  Hypoxic, 50s with EMS.  Improved on NRB + HFNC.  PCCM consulted and said [facility] admission is ok.   Subjective: Last 24 hours T-max 38.1 C, A/O x4, negative sick contacts, negative abdominal pain, negative N/V.  Positive S OB   Assessment & Plan:   Principal Problem:   Acute respiratory failure with hypoxia (HCC) Active Problems:   Acute respiratory distress syndrome (ARDS) due to COVID-19 virus (HCC)   Diabetes mellitus type 2 in obese [facility])   Essential hypertension   Pneumonia due to COVID-19 virus   Diabetes mellitus type 2, controlled, with complications (HCC)   Obese   Covid pneumonia/acute respiratory failure with hypoxia COVID-19 Labs  Recent Labs    03/06/2019 1513 03/16/19 0647 03/16/19 0652  DDIMER >20.00*  --  >20.00*  FERRITIN 595* 554*  --   LDH 694*  --   --   CRP 22.2* 24.6*  --     Lab Results  Component Value Date   SARSCOV2NAA POSITIVE (A) 03/14/2019   -Decadron 6 mg daily -12/14 Actemra x1 dose -12/14 transfused 1 unit, Covid convalescent plasma -Remdesivir per pharmacy protocol -Combivent QID -Flutter valve -Incentive spirometry -Prone patient for 16 hours/day; if unable prone 2 to 3  hours every shift -Titrate O2 to maintain SPO2> 88% -12/15 Actemra x2d dose  Pulmonary embolism/DVT?? -CTA PE protocol pending.   -Bilateral lower extremity ultrasound pending -Continue full dose Lovenox until we can determine if patient has PE or DVT  Essential HTN -BP currently controlled without medication monitor closely -Strict in and out -Daily weight  Diabetes type 2 controlled with complication  -12/14 hemoglobin A1c= 8.6 -Lantus 15 units daily -Moderate SSI  Acute kidney injury -Resolved  -Hold all nephrotoxic medication  Morbid obesity -When patient more stable diabetic nutrition consult  Poor oral Intake -D5-0.9% saline 73ml/hr     DVT prophylaxis: Full dose Lovenox Code Status: Full Family Communication: 12/15 spoke with Okey Regal (wife) counseled on plan of care answered all questions  Disposition Plan: TBD   Consultants:    Procedures/Significant Events:     I have personally reviewed and interpreted all radiology studies and my findings are as above.  VENTILATOR SETTINGS: HFNC + NRB Flow; 30 L/min FiO2; 100% SPO2; 95%   Cultures   Antimicrobials: Anti-infectives (From admission, onward)   Start     Dose/Rate Stop   03/16/19 1500  cefTRIAXone (ROCEPHIN) 2 g in sodium chloride 0.9 % 100 mL IVPB  Status:  Discontinued     2 g 200 mL/hr over 30 Minutes 03/09/2019 1900   03/16/19 1100  azithromycin (ZITHROMAX) 500 mg in  sodium chloride 0.9 % 250 mL IVPB  Status:  Discontinued     500 mg 250 mL/hr over 60 Minutes 03/31/2019 1900   03/16/19 1000  remdesivir 100 mg in sodium chloride 0.9 % 100 mL IVPB     100 mg 200 mL/hr over 30 Minutes 03/20/19 0959   03/13/2019 1745  cefTRIAXone (ROCEPHIN) 1 g in sodium chloride 0.9 % 100 mL IVPB  Status:  Discontinued     1 g 200 mL/hr over 30 Minutes 03/30/2019 1905   03/26/2019 1330  remdesivir 200 mg in sodium chloride 0.9% 250 mL IVPB     200 mg 580 mL/hr over 30 Minutes 03/14/2019 1603   03/16/2019 1015   cefTRIAXone (ROCEPHIN) 1 g in sodium chloride 0.9 % 100 mL IVPB     1 g 200 mL/hr over 30 Minutes 03/14/2019 1337   03/03/2019 1015  azithromycin (ZITHROMAX) 500 mg in sodium chloride 0.9 % 250 mL IVPB     500 mg 250 mL/hr over 60 Minutes 03/21/2019 1248       Devices    LINES / TUBES:      Continuous Infusions:  sodium chloride     dextrose 5 % and 0.9% NaCl     remdesivir 100 mg in NS 100 mL 100 mg (03/16/19 1003)     Objective: Vitals:   03/16/19 0500 03/16/19 0800 03/16/19 1010 03/16/19 1100  BP: (!) 77/47  (!) 109/57 126/79  Pulse: 90  92 98  Resp: (!) 21  (!) 23 (!) 21  Temp:  98 F (36.7 C)    TempSrc:  Oral    SpO2: 92%  (!) 84% 95%  Weight:      Height:        Intake/Output Summary (Last 24 hours) at 03/16/2019 1154 Last data filed at 03/16/2019 1100 Gross per 24 hour  Intake 240 ml  Output 1210 ml  Net -970 ml   Filed Weights   03/13/2019 0913  Weight: 127 kg    Examination:  General: A/O x4, positive acute respiratory distress Eyes: negative scleral hemorrhage, negative anisocoria, negative icterus ENT: Negative Runny nose, negative gingival bleeding, Neck:  Negative scars, masses, torticollis, lymphadenopathy, JVD Lungs: Tachypneic, diffuse decreased breath sounds without wheezes or crackles Cardiovascular: Regular rate and rhythm without murmur gallop or rub normal S1 and S2 Abdomen: MORBIDLY OBESE negative abdominal pain, nondistended, positive soft, bowel sounds, no rebound, no ascites, no appreciable mass Extremities: No significant cyanosis, clubbing, or edema bilateral lower extremities Skin: Negative rashes, lesions, ulcers Psychiatric:  Negative depression, negative anxiety, negative fatigue, negative mania  Central nervous system:  Cranial nerves II through XII intact, tongue/uvula midline, all extremities muscle strength 5/5, sensation intact throughout, negative dysarthria, negative expressive aphasia, negative receptive aphasia.  .       Data Reviewed: Care during the described time interval was provided by me .  I have reviewed this patient's available data, including medical history, events of note, physical examination, and all test results as part of my evaluation.   CBC: Recent Labs  Lab 03/30/2019 0922 03/13/2019 1149 03/16/19 0652  WBC 7.0  --  6.2  NEUTROABS 6.2  --  5.2  HGB 15.2 13.9 15.8  HCT 44.0 41.0 46.9  MCV 91.5  --  91.4  PLT 207  --  201   Basic Metabolic Panel: Recent Labs  Lab 03/03/2019 0922 03/16/2019 1149 03/16/19 0652  NA 136 133* 139  K 4.1 3.8 4.0  CL 98  --  102  CO2 22  --  23  GLUCOSE 185*  --  234*  BUN 38*  --  35*  CREATININE 1.68*  --  0.92  CALCIUM 8.3*  --  8.6*   GFR: Estimated Creatinine Clearance: 97 mL/min (by C-G formula based on SCr of 0.92 mg/dL). Liver Function Tests: Recent Labs  Lab April 05, 2019 0922 03/16/19 0652  AST 63* 68*  ALT 21 23  ALKPHOS 77 96  BILITOT 1.1 0.9  PROT 6.2* 6.7  ALBUMIN 2.7* 2.7*   No results for input(s): LIPASE, AMYLASE in the last 168 hours. No results for input(s): AMMONIA in the last 168 hours. Coagulation Profile: No results for input(s): INR, PROTIME in the last 168 hours. Cardiac Enzymes: No results for input(s): CKTOTAL, CKMB, CKMBINDEX, TROPONINI in the last 168 hours. BNP (last 3 results) No results for input(s): PROBNP in the last 8760 hours. HbA1C: Recent Labs    2019/04/05 1513  HGBA1C 6.6*   CBG: Recent Labs  Lab April 05, 2019 2124 03/16/19 0739  GLUCAP 193* 207*   Lipid Profile: No results for input(s): CHOL, HDL, LDLCALC, TRIG, CHOLHDL, LDLDIRECT in the last 72 hours. Thyroid Function Tests: No results for input(s): TSH, T4TOTAL, FREET4, T3FREE, THYROIDAB in the last 72 hours. Anemia Panel: Recent Labs    04-05-2019 1513 03/16/19 0647  FERRITIN 595* 554*   Urine analysis: No results found for: COLORURINE, APPEARANCEUR, LABSPEC, PHURINE, GLUCOSEU, HGBUR, BILIRUBINUR, KETONESUR, PROTEINUR, UROBILINOGEN,  NITRITE, LEUKOCYTESUR Sepsis Labs: (procalcitonin:4,lacticidven:4)  ) Recent Results (from the past 240 hour(s))  Respiratory Panel by RT PCR (Flu A&B, Covid) - Nasopharyngeal Swab     Status: Abnormal   Collection Time: April 05, 2019 10:52 AM   Specimen: Nasopharyngeal Swab  Result Value Ref Range Status   SARS Coronavirus 2 by RT PCR POSITIVE (A) NEGATIVE Final    Comment: RESULT CALLED TO, READ BACK BY AND VERIFIED WITH: Dallie Piles RN 12:40 04-05-2019 ([email or web address]) (NOTE) SARS-CoV-2 target nucleic acids are DETECTED. SARS-CoV-2 RNA is generally detectable in upper respiratory specimens  during the acute phase of infection. Positive results are indicative of the presence of the identified virus, but do not rule out bacterial infection or co-infection with other pathogens not detected by the test. Clinical correlation with patient history and other diagnostic information is necessary to determine patient infection status. The expected result is Negative. Fact Sheet for Patients:  https://www.moore.com/ Fact Sheet for Healthcare Providers: https://www.young.biz/ This test is not yet approved or cleared by the [place] FDA and  has been authorized for detection and/or diagnosis of SARS-CoV-2 by FDA under an Emergency Use Authorization (EUA).  This EUA will remain in effect (meaning this test can be used)  for the duration of  the COVID-19 declaration under Section 564(b)(1) of the Act, 21 U.S.C. section 360bbb-3(b)(1), unless the authorization is terminated or revoked sooner.    Influenza A by PCR NEGATIVE NEGATIVE Final   Influenza B by PCR NEGATIVE NEGATIVE Final    Comment: (NOTE) The Xpert Xpress SARS-CoV-2/FLU/RSV assay is intended as an aid in  the diagnosis of influenza from Nasopharyngeal swab specimens and  should not be used as a sole basis for treatment. Nasal washings and  aspirates are unacceptable for Xpert Xpress  SARS-CoV-2/FLU/RSV  testing. Fact Sheet for Patients: https://www.moore.com/ Fact Sheet for Healthcare Providers: https://www.young.biz/ This test is not yet approved or cleared by the [place] FDA and  has been authorized for detection and/or diagnosis of SARS-CoV-2 by  FDA under an Emergency Use Authorization (EUA).  This EUA will remain  in effect (meaning this test can be used) for the duration of the  Covid-19 declaration under Section 564(b)(1) of the Act, 21  U.S.C. section 360bbb-3(b)(1), unless the authorization is  terminated or revoked. Performed at [facility], [place] [address] [place], [place] [address]   MRSA PCR Screening     Status: None   Collection Time: 03/16/19  3:00 AM   Specimen: Nasopharyngeal  Result Value Ref Range Status   MRSA by PCR NEGATIVE NEGATIVE Final    Comment:        The GeneXpert MRSA Assay (FDA approved for NASAL specimens only), is one component of a comprehensive MRSA colonization surveillance program. It is not intended to diagnose MRSA infection nor to guide or monitor treatment for MRSA infections. Performed at [facility], [place] [address], [place], [place] [address]          Radiology Studies: DG Chest Portable 1 View  Result Date: 04-09-19 CLINICAL DATA:  Shortness of breath EXAM: PORTABLE CHEST 1 VIEW COMPARISON:  12/19/2015 FINDINGS: Lung volumes are low with diffuse interstitial and airspace opacities. Cardiomediastinal contours are likely normal accounting for low depth of inspiration and portable technique. No signs of lobar consolidation or visible effusion. IMPRESSION: Diffuse interstitial and airspace opacities may represent pneumonia or edema. Electronically Signed   By: Zetta Bills M.D.   On: 04-09-2019 09:47   ECHOCARDIOGRAM COMPLETE  Result Date: 03/16/2019   ECHOCARDIOGRAM REPORT   Patient Name:   David Robinson [facility] Date of Exam: 03/16/2019  Medical Rec #:  [id number]       Height:       72.0 in Accession #:    [id number]      Weight:       280.0 lb Date of Birth:  Aug 03, 1945       BSA:          2.46 m Patient Age:    73 years        BP:           77/47 mmHg Patient Gender: M               HR:           93 bpm. Exam Location:  Inpatient Procedure: 2D Echo, Cardiac Doppler and Color Doppler Indications:    Dyspnea  History:        Patient has no prior history of Echocardiogram examinations.                 Signs/Symptoms:Chest Pain and Shortness of Breath; Risk                 Factors:Hypertension and Diabetes. Covid positive.  Sonographer:    Dustin Flock Referring Phys: [place] Comments: Image acquisition challenging due to respiratory motion. IMPRESSIONS  1. Left ventricular ejection fraction, by visual estimation, is 60 to 65%. The left ventricle has normal function. There is mildly increased left ventricular hypertrophy.  2. Indeterminate diastolic filling due to E-A fusion.  3. The left ventricle has no regional wall motion abnormalities.  4. Global right ventricle has hyperdynamic systolic function.The right ventricular size is normal. No increase in right ventricular wall thickness.  5. Left atrial size was mildly dilated.  6. Right atrial size was normal.  7. Mild mitral annular calcification.  8. The mitral valve is normal in structure. No evidence of mitral valve regurgitation.  9. The tricuspid valve is  normal in structure. Tricuspid valve regurgitation is not demonstrated. 10. The aortic valve is normal in structure. Aortic valve regurgitation is not visualized. 11. The pulmonic valve was not well visualized. Pulmonic valve regurgitation is not visualized. 12. TR signal is inadequate for assessing pulmonary artery systolic pressure. 13. The inferior vena cava is normal in size with greater than 50% respiratory variability, suggesting right atrial pressure of 3 mmHg. FINDINGS  Left Ventricle: Left ventricular  ejection fraction, by visual estimation, is 60 to 65%. The left ventricle has normal function. The left ventricle has no regional wall motion abnormalities. The left ventricular internal cavity size was the left ventricle is normal in size. There is mildly increased left ventricular hypertrophy. Concentric left ventricular hypertrophy. Indeterminate diastolic filling due to E-A fusion. Right Ventricle: The right ventricular size is normal. No increase in right ventricular wall thickness. Global RV systolic function is has hyperdynamic systolic function. Left Atrium: Left atrial size was mildly dilated. Right Atrium: Right atrial size was normal in size Pericardium: There is no evidence of pericardial effusion. Mitral Valve: The mitral valve is normal in structure. Mild mitral annular calcification. No evidence of mitral valve regurgitation. Tricuspid Valve: The tricuspid valve is normal in structure. Tricuspid valve regurgitation is not demonstrated. Aortic Valve: The aortic valve is normal in structure. Aortic valve regurgitation is not visualized. Pulmonic Valve: The pulmonic valve was not well visualized. Pulmonic valve regurgitation is not visualized. Pulmonic regurgitation is not visualized. Aorta: The aortic root is normal in size and structure. Venous: The inferior vena cava is normal in size with greater than 50% respiratory variability, suggesting right atrial pressure of 3 mmHg. IAS/Shunts: No atrial level shunt detected by color flow Doppler.  LEFT VENTRICLE PLAX 2D LVIDd:         4.35 cm  Diastology LVIDs:         3.00 cm  LV e' lateral:   11.50 cm/s LV PW:         1.39 cm  LV E/e' lateral: 10.9 LV IVS:        1.43 cm  LV e' medial:    11.00 cm/s LVOT diam:     2.20 cm  LV E/e' medial:  11.4 LV SV:         50 ml LV SV Index:   19.45 LVOT Area:     3.80 cm  RIGHT VENTRICLE RV Basal diam:  3.55 cm RV S prime:     15.30 cm/s TAPSE (M-mode): 3.9 cm LEFT ATRIUM             Index       RIGHT ATRIUM            Index LA diam:        4.10 cm 1.67 cm/m  RA Area:     23.30 cm LA Vol (A2C):   60.0 ml 24.41 ml/m RA Volume:   77.20 ml  31.41 ml/m LA Vol (A4C):   92.2 ml 37.51 ml/m LA Biplane Vol: 75.5 ml 30.71 ml/m  AORTIC VALVE LVOT Vmax:   125.00 cm/s LVOT Vmean:  82.900 cm/s LVOT VTI:    0.201 m  AORTA Ao Root diam: 3.10 cm MITRAL VALVE MV Area (PHT): 4.71 cm             SHUNTS MV PHT:        46.69 msec           Systemic VTI:  0.20 m MV Decel Time: 161 msec  Systemic Diam: 2.20 cm MV E velocity: 125.00 cm/s 103 cm/s  Mihai Croitoru MD Electronically signed by Thurmon Fair MD Signature Date/Time: 03/16/2019/11:22:34 AM    Final         Scheduled Meds:  sodium chloride   Intravenous Once   acetaminophen  650 mg Oral Once   vitamin C  500 mg Oral Daily   Chlorhexidine Gluconate Cloth  6 each Topical Daily   dexamethasone (DECADRON) injection  6 mg Intravenous Q24H   diphenhydrAMINE  25 mg Intravenous Once   enoxaparin (LOVENOX) injection  1 mg/kg Subcutaneous Q12H   furosemide  20 mg Intravenous Once   insulin aspart  0-15 Units Subcutaneous TID WC   insulin aspart  0-5 Units Subcutaneous QHS   insulin glargine  15 Units Subcutaneous QHS   Ipratropium-Albuterol  1 puff Inhalation QID   sodium chloride flush  3 mL Intravenous Q12H   sodium chloride flush  3 mL Intravenous Q12H   zinc sulfate  220 mg Oral Daily   Continuous Infusions:  sodium chloride     dextrose 5 % and 0.9% NaCl     remdesivir 100 mg in NS 100 mL 100 mg (03/16/19 1003)     LOS: 1 day   The patient is critically ill with multiple organ systems failure and requires high complexity decision making for assessment and support, frequent evaluation and titration of therapies, application of advanced monitoring technologies and extensive interpretation of multiple databases. Critical Care Time devoted to patient care services described in this note  Time spent: 40 minutes     Lavonne Cass, Roselind Messier,  MD [facility] Pager [phone]  If 7PM-7AM, please contact night-coverage [email or web address] Password [email or web address] 03/16/2019, 11:54 AM

## 2019-03-16 NOTE — Progress Notes (Signed)
Call made to pt's wife. Update on condition and plan of care given. Questions answered.

## 2019-03-16 NOTE — Progress Notes (Signed)
eLink Physician-Brief Progress Note Patient Name: David Robinson DOB: October 17, 1945 MRN: [id number]   Date of Service  03/16/2019  HPI/Events of Note  Acute respiratory failure secondary to COVID 19 pneumonia, Pt now with saturation in the upper 70's, Sats now 95 % with proning.  eICU Interventions  Pt proned, Lasix 40 mg iv x 1, ABG, Precedex 0.0-0.3 mcg for mild sedation and anxiolysis while prone.        David Robinson 03/16/2019, 11:08 PM

## 2019-03-17 ENCOUNTER — Inpatient Hospital Stay (HOSPITAL_COMMUNITY): Payer: Medicare Other

## 2019-03-17 DIAGNOSIS — R7989 Other specified abnormal findings of blood chemistry: Secondary | ICD-10-CM

## 2019-03-17 DIAGNOSIS — E669 Obesity, unspecified: Secondary | ICD-10-CM

## 2019-03-17 DIAGNOSIS — E1169 Type 2 diabetes mellitus with other specified complication: Secondary | ICD-10-CM

## 2019-03-17 DIAGNOSIS — E118 Type 2 diabetes mellitus with unspecified complications: Secondary | ICD-10-CM

## 2019-03-17 DIAGNOSIS — I10 Essential (primary) hypertension: Secondary | ICD-10-CM

## 2019-03-17 DIAGNOSIS — J1289 Other viral pneumonia: Secondary | ICD-10-CM

## 2019-03-17 LAB — CBC WITH DIFFERENTIAL/PLATELET
Abs Immature Granulocytes: 0.18 10*3/uL — ABNORMAL HIGH (ref 0.00–0.07)
Basophils Absolute: 0.1 10*3/uL (ref 0.0–0.1)
Basophils Relative: 1 %
Eosinophils Absolute: 0 10*3/uL (ref 0.0–0.5)
Eosinophils Relative: 0 %
HCT: 43.8 % (ref 39.0–52.0)
Hemoglobin: 15.1 g/dL (ref 13.0–17.0)
Immature Granulocytes: 1 %
Lymphocytes Relative: 9 %
Lymphs Abs: 1.3 10*3/uL (ref 0.7–4.0)
MCH: 31.8 pg (ref 26.0–34.0)
MCHC: 34.5 g/dL (ref 30.0–36.0)
MCV: 92.2 fL (ref 80.0–100.0)
Monocytes Absolute: 0.6 10*3/uL (ref 0.1–1.0)
Monocytes Relative: 4 %
Neutro Abs: 12.2 10*3/uL — ABNORMAL HIGH (ref 1.7–7.7)
Neutrophils Relative %: 85 %
Platelets: 214 10*3/uL (ref 150–400)
RBC: 4.75 MIL/uL (ref 4.22–5.81)
RDW: 14.6 % (ref 11.5–15.5)
WBC: 14.3 10*3/uL — ABNORMAL HIGH (ref 4.0–10.5)
nRBC: 0 % (ref 0.0–0.2)

## 2019-03-17 LAB — GLUCOSE, CAPILLARY
Glucose-Capillary: 237 mg/dL — ABNORMAL HIGH (ref 70–99)
Glucose-Capillary: 246 mg/dL — ABNORMAL HIGH (ref 70–99)
Glucose-Capillary: 211 mg/dL — ABNORMAL HIGH (ref 70–99)
Glucose-Capillary: 183 mg/dL — ABNORMAL HIGH (ref 70–99)
Glucose-Capillary: 217 mg/dL — ABNORMAL HIGH (ref 70–99)

## 2019-03-17 LAB — COMPREHENSIVE METABOLIC PANEL
ALT: 20 U/L (ref 0–44)
AST: 62 U/L — ABNORMAL HIGH (ref 15–41)
Albumin: 2.6 g/dL — ABNORMAL LOW (ref 3.5–5.0)
Alkaline Phosphatase: 121 U/L (ref 38–126)
Anion gap: 12 (ref 5–15)
BUN: 43 mg/dL — ABNORMAL HIGH (ref 8–23)
CO2: 25 mmol/L (ref 22–32)
Calcium: 8.6 mg/dL — ABNORMAL LOW (ref 8.9–10.3)
Chloride: 102 mmol/L (ref 98–111)
Creatinine, Ser: 0.85 mg/dL (ref 0.61–1.24)
GFR calc Af Amer: >60 mL/min (ref >60)
GFR calc non Af Amer: >60 mL/min (ref >60)
Glucose, Bld: 230 mg/dL — ABNORMAL HIGH (ref 70–99)
Potassium: 4.1 mmol/L (ref 3.5–5.1)
Sodium: 139 mmol/L (ref 135–145)
Total Bilirubin: 0.6 mg/dL (ref 0.3–1.2)
Total Protein: 6.1 g/dL — ABNORMAL LOW (ref 6.5–8.1)

## 2019-03-17 LAB — D-DIMER, QUANTITATIVE: D-Dimer, Quant: >20 ug/mL-FEU — ABNORMAL HIGH (ref 0.00–0.50)

## 2019-03-17 LAB — MAGNESIUM: Magnesium: 1.8 mg/dL (ref 1.7–2.4)

## 2019-03-17 LAB — FERRITIN: Ferritin: 567 ng/mL — ABNORMAL HIGH (ref 24–336)

## 2019-03-17 LAB — PHOSPHORUS: Phosphorus: 2 mg/dL — ABNORMAL LOW (ref 2.5–4.6)

## 2019-03-17 LAB — C-REACTIVE PROTEIN: CRP: 11.1 mg/dL — ABNORMAL HIGH (ref <1.0)

## 2019-03-17 MED ORDER — CHLORHEXIDINE GLUCONATE 0.12 % MT SOLN
15.0000 mL | Freq: Two times a day (BID) | OROMUCOSAL | Status: DC
  Administered 2019-03-17 – 2019-03-21 (×10): 15 mL via OROMUCOSAL
  Filled 2019-03-17 (×8): qty 15

## 2019-03-17 MED ORDER — INSULIN ASPART 100 UNIT/ML ~~LOC~~ SOLN
2.0000 [IU] | Freq: Three times a day (TID) | SUBCUTANEOUS | Status: DC
  Administered 2019-03-17 – 2019-03-20 (×9): 2 [IU] via SUBCUTANEOUS

## 2019-03-17 MED ORDER — INSULIN GLARGINE 100 UNIT/ML ~~LOC~~ SOLN
20.0000 [IU] | Freq: Every day | SUBCUTANEOUS | Status: DC
  Administered 2019-03-17 – 2019-03-19 (×3): 20 [IU] via SUBCUTANEOUS
  Filled 2019-03-17 (×4): qty 0.2

## 2019-03-17 MED ORDER — ORAL CARE MOUTH RINSE
15.0000 mL | Freq: Two times a day (BID) | OROMUCOSAL | Status: DC
  Administered 2019-03-17 – 2019-03-21 (×10): 15 mL via OROMUCOSAL

## 2019-03-17 NOTE — Progress Notes (Signed)
Pt on HFNC 30L/100%. Sats noted to be at 80 and sustaining. Pt attempted to prone with no success. Deep breathing and repositioning to the side done but with no improvements in sats.  RT called to the bedside for further assistance. ABG done. Dr. Lucile Shutters notified. 40mg  of lasix ordered. Precedex drip ordered. Pt able to prone with more assistance after education on risk factors of not proning. Improvement of sats noted. HFNC currently 40L/100%. Sats currently in the mid 90s. No current issues noted at this time.

## 2019-03-17 NOTE — Progress Notes (Signed)
PROGRESS NOTE  [place] [facility] ZOX:[id number] DOB: 22-Jun-1945 DOA: 03/04/2019 PCP: Burton Apley, MD   LOS: 2 days   Brief Narrative / Interim history: 73 year old male with type 2 diabetes mellitus, obese, came to the hospital with shortness of breath and was admitted on 03/13/2019.  He has been feeling sick for the past week and developed shortness of breath couple of days before coming to the emergency room.  He was so weak that he was barely able to walk  Subjective / 24h Interval events: Remains short of breath, laying on the right side.  Denies any chest pain, no abdominal pain, no nausea or vomiting.  Assessment & Plan:  Principal Problem Acute Hypoxic Respiratory Failure due to Covid-19 Viral Illness  FiO2 (%):  [100 %] 100 %    -Patient has been admitted to the ICU, continue to monitor in the setting.  He has been placed on remdesivir, steroids. -Received Actemra on 12/14 as well as convalescent plasma.  Received a second dose of Actemra on 12/15 -Prone as able, continue to titrate O2 to maintain O2 sats above 88% -D-dimer significantly elevated, he was ordered a CT angiogram as well as lower extremity venous Doppler, and the latter showed acute DVT in the right peroneal veins  COVID-19 Labs  Recent Labs    04/01/2019 1513 03/16/19 0647 03/16/19 0652 03/16/19 1800 03/17/19 0630  DDIMER >20.00*  --  >20.00* >20.00* >20.00*  FERRITIN 595* 554*  --   --  567*  LDH 694*  --   --   --   --   CRP 22.2* 24.6*  --   --  11.1*    Lab Results  Component Value Date   SARSCOV2NAA POSITIVE (A) 04/01/2019    Active Problems Acute bilateral DVT, right peroneal veins and left gastrocnemius veins -Ultrasound also consistent with age-indeterminate superficial vein thrombosis of the right great saphenous vein -He is on full dose Lovenox, continue -Possibly has a pulmonary embolus as well, CTPA pending  Type 2 diabetes mellitus -Increase Lantus as well as admit time scheduled  NovoLog given elevated CBGs, continue to monitor  CBG (last 3)  Recent Labs    03/16/19 2127 03/17/19 0758 03/17/19 1157  GLUCAP 263* 246* 211*   Essential hypertension -Monitor blood pressure daily  Acute kidney injury -Resolved, hold nephrotoxins  Obesity -BMI 37, patient would benefit from weight loss  Scheduled Meds: . sodium chloride   Intravenous Once  . acetaminophen  650 mg Oral Once  . vitamin C  500 mg Oral Daily  . chlorhexidine  15 mL Mouth Rinse BID  . Chlorhexidine Gluconate Cloth  6 each Topical Daily  . dexamethasone (DECADRON) injection  6 mg Intravenous Q24H  . enoxaparin (LOVENOX) injection  1 mg/kg Subcutaneous Q12H  . insulin aspart  0-15 Units Subcutaneous TID WC  . insulin aspart  0-5 Units Subcutaneous QHS  . insulin glargine  15 Units Subcutaneous QHS  . Ipratropium-Albuterol  1 puff Inhalation Q6H  . mouth rinse  15 mL Mouth Rinse q12n4p  . sodium chloride flush  3 mL Intravenous Q12H  . sodium chloride flush  3 mL Intravenous Q12H  . zinc sulfate  220 mg Oral Daily   Continuous Infusions: . sodium chloride    . dexmedetomidine (PRECEDEX) IV infusion 0.1 mcg/kg/hr (03/17/19 0600)  . dextrose 5 % and 0.9% NaCl 50 mL/hr at 03/17/19 0600  . remdesivir 100 mg in NS 100 mL 100 mg (03/17/19 0928)   PRN Meds:.sodium chloride,  acetaminophen, bisacodyl, chlorpheniramine-HYDROcodone, ondansetron **OR** ondansetron (ZOFRAN) IV, oxyCODONE, polyethylene glycol, sodium chloride flush, sodium phosphate  DVT prophylaxis: Lovenox Code Status: Full code Family Communication: wife Arbie Cookey [phone] Disposition Plan: home when ready   Consultants:  None   Procedures:  None   Microbiology: Blood cultures 12/14 - no growth   Antimicrobials: Remdesivir  Objective: Vitals:   03/17/19 1100 03/17/19 1200 03/17/19 1300 03/17/19 1400  BP: 96/79 95/68 136/71 (!) 146/70  Pulse: 82 68 92 89  Resp: (!) 31 (!) 29 (!) 23 (!) 24  Temp:  (!) 96.3 F (35.7 C)     TempSrc:  Axillary    SpO2: 90% 91% 92% (!) 89%  Weight:      Height:        Intake/Output Summary (Last 24 hours) at 03/17/2019 1451 Last data filed at 03/17/2019 1400 Gross per 24 hour  Intake 1868.96 ml  Output 1200 ml  Net 668.96 ml   Filed Weights   04-05-19 0913  Weight: 127 kg    Examination:  Constitutional: In prone position Eyes: no scleral icterus ENMT: Mucous membranes are moist.  Neck: normal, supple Respiratory: Diffuse rhonchi bilaterally, no wheezing, no crackles, normal respiratory effort Cardiovascular: Regular rate and rhythm, no murmurs / rubs / gallops. No LE edema.  Abdomen: non distended, no tenderness.  Musculoskeletal: no clubbing / cyanosis.  Skin: no rashes Neurologic: Nonfocal, moves all 4 independently Psychiatric: Normal judgment and insight. Alert and oriented x 3. Normal mood.    Data Reviewed: I have independently reviewed following labs and imaging studies   CBC: Recent Labs  Lab 04-05-19 0922 04-05-19 1149 03/16/19 0652 03/16/19 2249 03/17/19 0630  WBC 7.0  --  6.2  --  14.3*  NEUTROABS 6.2  --  5.2  --  12.2*  HGB 15.2 13.9 15.8 14.3 15.1  HCT 44.0 41.0 46.9 42.0 43.8  MCV 91.5  --  91.4  --  92.2  PLT 207  --  201  --  644   Basic Metabolic Panel: Recent Labs  Lab 04-05-19 0922 04/05/19 1149 03/16/19 0652 03/16/19 2249 03/17/19 0630  NA 136 133* 139 137 139  K 4.1 3.8 4.0 3.8 4.1  CL 98  --  102  --  102  CO2 22  --  23  --  25  GLUCOSE 185*  --  234*  --  230*  BUN 38*  --  35*  --  43*  CREATININE 1.68*  --  0.92  --  0.85  CALCIUM 8.3*  --  8.6*  --  8.6*  MG  --   --   --   --  1.8  PHOS  --   --   --   --  2.0*   GFR: Estimated Creatinine Clearance: 105 mL/min (by C-G formula based on SCr of 0.85 mg/dL). Liver Function Tests: Recent Labs  Lab 2019-04-05 0922 03/16/19 0652 03/17/19 0630  AST 63* 68* 62*  ALT 21 23 20   ALKPHOS 77 96 121  BILITOT 1.1 0.9 0.6  PROT 6.2* 6.7 6.1*  ALBUMIN 2.7*  2.7* 2.6*   No results for input(s): LIPASE, AMYLASE in the last 168 hours. No results for input(s): AMMONIA in the last 168 hours. Coagulation Profile: No results for input(s): INR, PROTIME in the last 168 hours. Cardiac Enzymes: No results for input(s): CKTOTAL, CKMB, CKMBINDEX, TROPONINI in the last 168 hours. BNP (last 3 results) No results for input(s): PROBNP in the last 8760 hours. HbA1C:  Recent Labs    03/19/2019 1513  HGBA1C 6.6*   CBG: Recent Labs  Lab 03/16/19 1131 03/16/19 1632 03/16/19 2127 03/17/19 0758 03/17/19 1157  GLUCAP 266* 237* 263* 246* 211*   Lipid Profile: No results for input(s): CHOL, HDL, LDLCALC, TRIG, CHOLHDL, LDLDIRECT in the last 72 hours. Thyroid Function Tests: No results for input(s): TSH, T4TOTAL, FREET4, T3FREE, THYROIDAB in the last 72 hours. Anemia Panel: Recent Labs    03/16/19 0647 03/17/19 0630  FERRITIN 554* 567*   Urine analysis: No results found for: COLORURINE, APPEARANCEUR, LABSPEC, PHURINE, GLUCOSEU, HGBUR, BILIRUBINUR, KETONESUR, PROTEINUR, UROBILINOGEN, NITRITE, LEUKOCYTESUR Sepsis Labs: Invalid input(s): PROCALCITONIN, LACTICIDVEN  Recent Results (from the past 240 hour(s))  Blood culture (routine x 2)     Status: None (Preliminary result)   Collection Time: 19-Mar-2019  9:22 AM   Specimen: BLOOD  Result Value Ref Range Status   Specimen Description BLOOD LEFT ANTECUBITAL  Final   Special Requests   Final    BOTTLES DRAWN AEROBIC AND ANAEROBIC Blood Culture adequate volume   Culture   Final    NO GROWTH 2 DAYS Performed at [facility], [address] [place], [place] [address]    Report Status PENDING  Incomplete  Respiratory Panel by RT PCR (Flu A&B, Covid) - Nasopharyngeal Swab     Status: Abnormal   Collection Time: 03-19-2019 10:52 AM   Specimen: Nasopharyngeal Swab  Result Value Ref Range Status   SARS Coronavirus 2 by RT PCR POSITIVE (A) NEGATIVE Final    Comment: RESULT CALLED TO, READ BACK BY AND  VERIFIED WITH: Dallie Piles RN 12:40 03-19-19 ([email or web address]) (NOTE) SARS-CoV-2 target nucleic acids are DETECTED. SARS-CoV-2 RNA is generally detectable in upper respiratory specimens  during the acute phase of infection. Positive results are indicative of the presence of the identified virus, but do not rule out bacterial infection or co-infection with other pathogens not detected by the test. Clinical correlation with patient history and other diagnostic information is necessary to determine patient infection status. The expected result is Negative. Fact Sheet for Patients:  https://www.moore.com/ Fact Sheet for Healthcare Providers: https://www.young.biz/ This test is not yet approved or cleared by the [place] FDA and  has been authorized for detection and/or diagnosis of SARS-CoV-2 by FDA under an Emergency Use Authorization (EUA).  This EUA will remain in effect (meaning this test can be used)  for the duration of  the COVID-19 declaration under Section 564(b)(1) of the Act, 21 U.S.C. section 360bbb-3(b)(1), unless the authorization is terminated or revoked sooner.    Influenza A by PCR NEGATIVE NEGATIVE Final   Influenza B by PCR NEGATIVE NEGATIVE Final    Comment: (NOTE) The Xpert Xpress SARS-CoV-2/FLU/RSV assay is intended as an aid in  the diagnosis of influenza from Nasopharyngeal swab specimens and  should not be used as a sole basis for treatment. Nasal washings and  aspirates are unacceptable for Xpert Xpress SARS-CoV-2/FLU/RSV  testing. Fact Sheet for Patients: https://www.moore.com/ Fact Sheet for Healthcare Providers: https://www.young.biz/ This test is not yet approved or cleared by the [place] FDA and  has been authorized for detection and/or diagnosis of SARS-CoV-2 by  FDA under an Emergency Use Authorization (EUA). This EUA will remain  in effect (meaning this test can be used) for  the duration of the  Covid-19 declaration under Section 564(b)(1) of the Act, 21  U.S.C. section 360bbb-3(b)(1), unless the authorization is  terminated or revoked. Performed at [facility], [address], [place], [place] [address]   Culture, blood (Routine X 2) w Reflex to ID Panel     Status: None (Preliminary result)   Collection Time: 2019-03-29  3:13 PM   Specimen: BLOOD RIGHT HAND  Result Value Ref Range Status   Specimen Description BLOOD RIGHT HAND  Final   Special Requests   Final    BOTTLES DRAWN AEROBIC ONLY Blood Culture results may not be optimal due to an inadequate volume of blood received in culture bottles   Culture   Final    NO GROWTH 2 DAYS Performed at [facility], [address] [place], [place] [address]    Report Status PENDING  Incomplete  MRSA PCR Screening     Status: None   Collection Time: 03/16/19  3:00 AM   Specimen: Nasopharyngeal  Result Value Ref Range Status   MRSA by PCR NEGATIVE NEGATIVE Final    Comment:        The GeneXpert MRSA Assay (FDA approved for NASAL specimens only), is one component of a comprehensive MRSA colonization surveillance program. It is not intended to diagnose MRSA infection nor to guide or monitor treatment for MRSA infections. Performed at [facility], [address] [place], [place] [address]       Radiology Studies: ECHOCARDIOGRAM COMPLETE  Result Date: 03/16/2019   ECHOCARDIOGRAM REPORT   Patient Name:   LINO WICKLIFF [facility] Date of Exam: 03/16/2019 Medical Rec #:  [id number]       Height:       72.0 in Accession #:    [id number]      Weight:       280.0 lb Date of Birth:  [birth year]       BSA:          2.46 m Patient Age:    73 years        BP:           77/47 mmHg Patient Gender: M               HR:           93 bpm. Exam Location:  Inpatient Procedure: 2D Echo, Cardiac Doppler and Color Doppler Indications:    Dyspnea  History:        Patient has no prior history of  Echocardiogram examinations.                 Signs/Symptoms:Chest Pain and Shortness of Breath; Risk                 Factors:Hypertension and Diabetes. Covid positive.  Sonographer:    Lavenia Atlas Referring Phys: [phone] Werner Lean [facility]  Sonographer Comments: Image acquisition challenging due to respiratory motion. IMPRESSIONS  1. Left ventricular ejection fraction, by visual estimation, is 60 to 65%. The left ventricle has normal function. There is mildly increased left ventricular hypertrophy.  2. Indeterminate diastolic filling due to E-A fusion.  3. The left ventricle has no regional wall motion abnormalities.  4. Global right ventricle has hyperdynamic systolic function.The right ventricular size is normal. No increase in right ventricular wall thickness.  5. Left atrial size was mildly dilated.  6. Right atrial size was normal.  7. Mild mitral annular calcification.  8. The mitral valve is normal in structure. No evidence of mitral valve regurgitation.  9. The tricuspid valve is normal in structure. Tricuspid valve regurgitation is not demonstrated. 10. The aortic valve is normal in structure. Aortic valve regurgitation is  not visualized. 11. The pulmonic valve was not well visualized. Pulmonic valve regurgitation is not visualized. 12. TR signal is inadequate for assessing pulmonary artery systolic pressure. 13. The inferior vena cava is normal in size with greater than 50% respiratory variability, suggesting right atrial pressure of 3 mmHg. FINDINGS  Left Ventricle: Left ventricular ejection fraction, by visual estimation, is 60 to 65%. The left ventricle has normal function. The left ventricle has no regional wall motion abnormalities. The left ventricular internal cavity size was the left ventricle is normal in size. There is mildly increased left ventricular hypertrophy. Concentric left ventricular hypertrophy. Indeterminate diastolic filling due to E-A fusion. Right Ventricle: The right ventricular  size is normal. No increase in right ventricular wall thickness. Global RV systolic function is has hyperdynamic systolic function. Left Atrium: Left atrial size was mildly dilated. Right Atrium: Right atrial size was normal in size Pericardium: There is no evidence of pericardial effusion. Mitral Valve: The mitral valve is normal in structure. Mild mitral annular calcification. No evidence of mitral valve regurgitation. Tricuspid Valve: The tricuspid valve is normal in structure. Tricuspid valve regurgitation is not demonstrated. Aortic Valve: The aortic valve is normal in structure. Aortic valve regurgitation is not visualized. Pulmonic Valve: The pulmonic valve was not well visualized. Pulmonic valve regurgitation is not visualized. Pulmonic regurgitation is not visualized. Aorta: The aortic root is normal in size and structure. Venous: The inferior vena cava is normal in size with greater than 50% respiratory variability, suggesting right atrial pressure of 3 mmHg. IAS/Shunts: No atrial level shunt detected by color flow Doppler.  LEFT VENTRICLE PLAX 2D LVIDd:         4.35 cm  Diastology LVIDs:         3.00 cm  LV e' lateral:   11.50 cm/s LV PW:         1.39 cm  LV E/e' lateral: 10.9 LV IVS:        1.43 cm  LV e' medial:    11.00 cm/s LVOT diam:     2.20 cm  LV E/e' medial:  11.4 LV SV:         50 ml LV SV Index:   19.45 LVOT Area:     3.80 cm  RIGHT VENTRICLE RV Basal diam:  3.55 cm RV S prime:     15.30 cm/s TAPSE (M-mode): 3.9 cm LEFT ATRIUM             Index       RIGHT ATRIUM           Index LA diam:        4.10 cm 1.67 cm/m  RA Area:     23.30 cm LA Vol (A2C):   60.0 ml 24.41 ml/m RA Volume:   77.20 ml  31.41 ml/m LA Vol (A4C):   92.2 ml 37.51 ml/m LA Biplane Vol: 75.5 ml 30.71 ml/m  AORTIC VALVE LVOT Vmax:   125.00 cm/s LVOT Vmean:  82.900 cm/s LVOT VTI:    0.201 m  AORTA Ao Root diam: 3.10 cm MITRAL VALVE MV Area (PHT): 4.71 cm             SHUNTS MV PHT:        46.69 msec           Systemic VTI:   0.20 m MV Decel Time: 161 msec             Systemic Diam: 2.20 cm MV E velocity: 125.00 cm/s 103  cm/s  Thurmon FairMihai Croitoru MD Electronically signed by Thurmon FairMihai Croitoru MD Signature Date/Time: 03/16/2019/11:22:34 AM    Final    VAS US LOWER EXTREMITY VENOUS (DVT)  Result Date: 03/17/2019  Lower Venous Study Indications: Elevated Ddimer.  Risk Factors: COVID 19 positive. Limitations: Poor ultrasound/tissue interface, body habitus and patient movement, patient positioning. Comparison Study: No prior studies. Performing Technologist: Chanda BusingGregory Collins RVT  Examination Guidelines: A complete evaluation includes B-mode imaging, spectral Doppler, color Doppler, and power Doppler as needed of all accessible portions of each vessel. Bilateral testing is considered an integral part of a complete examination. Limited examinations for reoccurring indications may be performed as noted.  +---------+---------------+---------+-----------+----------+-----------------+ RIGHT    CompressibilityPhasicitySpontaneityPropertiesThrombus Aging    +---------+---------------+---------+-----------+----------+-----------------+ CFV      Full           Yes      Yes                                    +---------+---------------+---------+-----------+----------+-----------------+ SFJ      Full                                                           +---------+---------------+---------+-----------+----------+-----------------+ FV Prox  Full                                                           +---------+---------------+---------+-----------+----------+-----------------+ FV Mid   Full                                                           +---------+---------------+---------+-----------+----------+-----------------+ FV DistalFull                                                           +---------+---------------+---------+-----------+----------+-----------------+ PFV      Full                                                            +---------+---------------+---------+-----------+----------+-----------------+ POP      Full           Yes      Yes                                    +---------+---------------+---------+-----------+----------+-----------------+ PTV      Full                                                           +---------+---------------+---------+-----------+----------+-----------------+  PERO     None                                         Acute             +---------+---------------+---------+-----------+----------+-----------------+ GSV      Partial                                      Age Indeterminate +---------+---------------+---------+-----------+----------+-----------------+   +---------+---------------+---------+-----------+----------+--------------+ LEFT     CompressibilityPhasicitySpontaneityPropertiesThrombus Aging +---------+---------------+---------+-----------+----------+--------------+ CFV      Full           Yes      Yes                                 +---------+---------------+---------+-----------+----------+--------------+ SFJ      Full                                                        +---------+---------------+---------+-----------+----------+--------------+ FV Prox  Full                                                        +---------+---------------+---------+-----------+----------+--------------+ FV Mid   Full                                                        +---------+---------------+---------+-----------+----------+--------------+ FV DistalFull                                                        +---------+---------------+---------+-----------+----------+--------------+ PFV      Full                                                        +---------+---------------+---------+-----------+----------+--------------+ POP      Full           Yes      Yes                                  +---------+---------------+---------+-----------+----------+--------------+ PTV      Full                                                        +---------+---------------+---------+-----------+----------+--------------+  PERO                                                  Not visualized +---------+---------------+---------+-----------+----------+--------------+ Gastroc  None                                         Acute          +---------+---------------+---------+-----------+----------+--------------+     Summary: Right: Findings consistent with acute deep vein thrombosis involving the right peroneal veins. Findings consistent with age indeterminate superficial vein thrombosis involving the right great saphenous vein. No cystic structure found in the popliteal fossa. Left: Findings consistent with acute deep vein thrombosis involving the left gastrocnemius veins. No cystic structure found in the popliteal fossa.  *See table(s) above for measurements and observations. Electronically signed by Sherald Hess MD on 03/17/2019 at 12:53:34 PM.    Final     Pamella Pert, MD, PhD [facility]  Contact via  [email or web address]  [facility] Office Info P: [phone] F: [phone]

## 2019-03-17 NOTE — Progress Notes (Signed)
Inpatient Diabetes Program Recommendations  AACE/ADA: New Consensus Statement on Inpatient Glycemic Control (2015)  Target Ranges:  Prepandial:   less than 140 mg/dL      Peak postprandial:   less than 180 mg/dL (1-2 hours)      Critically ill patients:  140 - 180 mg/dL   Lab Results  Component Value Date   GLUCAP 246 (H) 03/17/2019   HGBA1C 6.6 (H) 03/09/2019    Review of Glycemic Control Results for SAGE, KOPERA (MRN [id number]) as of 03/17/2019 12:00  Ref. Range 03/16/2019 11:31 03/16/2019 16:32 03/16/2019 21:27 03/17/2019 07:58  Glucose-Capillary Latest Ref Range: 70 - 99 mg/dL 266 (H) 237 (H) 263 (H) 246 (H)   Diabetes history: DM 2 Outpatient Diabetes medications: Glucotrol XL 10 mg daily, Actos 30 mg daily Current orders for Inpatient glycemic control:  Novolog moderate tid with meals and HS Decadron 6 mg daily Lantus 15 units q HS Inpatient Diabetes Program Recommendations:    Please consider increasing Lantus 20 units q HS.  Also consider adding 3 units tid with meals.  Thanks,  Adah Perl, RN, BC-ADM Inpatient Diabetes Coordinator Pager [phone] (8a-5p)

## 2019-03-17 NOTE — Progress Notes (Signed)
Bilateral lower extremity venous duplex has been completed. Preliminary results can be found in CV Proc through chart review.  Results were given to the patient's nurse, Helene Kelp.  03/17/19 11:02 AM Carlos Levering RVT

## 2019-03-18 ENCOUNTER — Encounter (HOSPITAL_COMMUNITY): Payer: Self-pay

## 2019-03-18 LAB — BPAM FFP
Blood Product Expiration Date: 202012160656
Blood Product Expiration Date: 202012180032
ISSUE DATE / TIME: 202012150712
ISSUE DATE / TIME: 202012170104
Unit Type and Rh: 5100
Unit Type and Rh: 5100

## 2019-03-18 LAB — CBC WITH DIFFERENTIAL/PLATELET
Abs Immature Granulocytes: 0.17 10*3/uL — ABNORMAL HIGH (ref 0.00–0.07)
Basophils Absolute: 0 10*3/uL (ref 0.0–0.1)
Basophils Relative: 0 %
Eosinophils Absolute: 0 10*3/uL (ref 0.0–0.5)
Eosinophils Relative: 0 %
HCT: 41.2 % (ref 39.0–52.0)
Hemoglobin: 13.9 g/dL (ref 13.0–17.0)
Immature Granulocytes: 2 %
Lymphocytes Relative: 8 %
Lymphs Abs: 0.8 10*3/uL (ref 0.7–4.0)
MCH: 31.2 pg (ref 26.0–34.0)
MCHC: 33.7 g/dL (ref 30.0–36.0)
MCV: 92.4 fL (ref 80.0–100.0)
Monocytes Absolute: 0.4 10*3/uL (ref 0.1–1.0)
Monocytes Relative: 4 %
Neutro Abs: 8.6 10*3/uL — ABNORMAL HIGH (ref 1.7–7.7)
Neutrophils Relative %: 86 %
Platelets: 209 10*3/uL (ref 150–400)
RBC: 4.46 MIL/uL (ref 4.22–5.81)
RDW: 14.6 % (ref 11.5–15.5)
WBC: 9.9 10*3/uL (ref 4.0–10.5)
nRBC: 0.2 % (ref 0.0–0.2)

## 2019-03-18 LAB — PREPARE FRESH FROZEN PLASMA: Unit division: 0

## 2019-03-18 LAB — GLUCOSE, CAPILLARY
Glucose-Capillary: 213 mg/dL — ABNORMAL HIGH (ref 70–99)
Glucose-Capillary: 205 mg/dL — ABNORMAL HIGH (ref 70–99)
Glucose-Capillary: 172 mg/dL — ABNORMAL HIGH (ref 70–99)
Glucose-Capillary: 202 mg/dL — ABNORMAL HIGH (ref 70–99)

## 2019-03-18 LAB — C-REACTIVE PROTEIN: CRP: 5.9 mg/dL — ABNORMAL HIGH (ref <1.0)

## 2019-03-18 LAB — COMPREHENSIVE METABOLIC PANEL
ALT: 26 U/L (ref 0–44)
AST: 66 U/L — ABNORMAL HIGH (ref 15–41)
Albumin: 2.6 g/dL — ABNORMAL LOW (ref 3.5–5.0)
Alkaline Phosphatase: 197 U/L — ABNORMAL HIGH (ref 38–126)
Anion gap: 13 (ref 5–15)
BUN: 44 mg/dL — ABNORMAL HIGH (ref 8–23)
CO2: 26 mmol/L (ref 22–32)
Calcium: 8.4 mg/dL — ABNORMAL LOW (ref 8.9–10.3)
Chloride: 100 mmol/L (ref 98–111)
Creatinine, Ser: 0.89 mg/dL (ref 0.61–1.24)
GFR calc Af Amer: >60 mL/min (ref >60)
GFR calc non Af Amer: >60 mL/min (ref >60)
Glucose, Bld: 224 mg/dL — ABNORMAL HIGH (ref 70–99)
Potassium: 3.8 mmol/L (ref 3.5–5.1)
Sodium: 139 mmol/L (ref 135–145)
Total Bilirubin: 0.8 mg/dL (ref 0.3–1.2)
Total Protein: 6 g/dL — ABNORMAL LOW (ref 6.5–8.1)

## 2019-03-18 LAB — D-DIMER, QUANTITATIVE: D-Dimer, Quant: 15.83 ug/mL-FEU — ABNORMAL HIGH (ref 0.00–0.50)

## 2019-03-18 LAB — FERRITIN: Ferritin: 638 ng/mL — ABNORMAL HIGH (ref 24–336)

## 2019-03-18 LAB — PHOSPHORUS: Phosphorus: 2.6 mg/dL (ref 2.5–4.6)

## 2019-03-18 LAB — MAGNESIUM: Magnesium: 1.8 mg/dL (ref 1.7–2.4)

## 2019-03-18 MED ORDER — FUROSEMIDE 10 MG/ML IJ SOLN
40.0000 mg | Freq: Two times a day (BID) | INTRAMUSCULAR | Status: AC
  Administered 2019-03-18 (×2): 40 mg via INTRAVENOUS
  Filled 2019-03-18 (×2): qty 4

## 2019-03-18 MED ORDER — POTASSIUM CHLORIDE 20 MEQ/15ML (10%) PO SOLN
40.0000 meq | Freq: Once | ORAL | Status: AC
  Administered 2019-03-18: 40 meq via ORAL
  Filled 2019-03-18: qty 30

## 2019-03-18 MED ORDER — HYDROXYZINE HCL 10 MG PO TABS
10.0000 mg | ORAL_TABLET | Freq: Three times a day (TID) | ORAL | Status: AC | PRN
  Administered 2019-03-18: 10 mg via ORAL
  Filled 2019-03-18: qty 1

## 2019-03-18 MED ORDER — PNEUMOCOCCAL VAC POLYVALENT 25 MCG/0.5ML IJ INJ
0.5000 mL | INJECTION | INTRAMUSCULAR | Status: DC | PRN
  Filled 2019-03-18: qty 0.5

## 2019-03-18 MED ORDER — BUSPIRONE HCL 5 MG PO TABS
5.0000 mg | ORAL_TABLET | Freq: Two times a day (BID) | ORAL | Status: DC
  Administered 2019-03-18 – 2019-03-21 (×6): 5 mg via ORAL
  Filled 2019-03-18 (×10): qty 1

## 2019-03-18 NOTE — Plan of Care (Signed)
  Problem: Education: Goal: Knowledge of risk factors and measures for prevention of condition will improve Outcome: Progressing   Problem: Coping: Goal: Psychosocial and spiritual needs will be supported Outcome: Progressing   Problem: Respiratory: Goal: Will maintain a patent airway Outcome: Progressing Goal: Complications related to the disease process, condition or treatment will be avoided or minimized Outcome: Progressing   Problem: Education: Goal: Knowledge of General Education information will improve Description: Including pain rating scale, medication(s)/side effects and non-pharmacologic comfort measures Outcome: Progressing   Problem: Health Behavior/Discharge Planning: Goal: Ability to manage health-related needs will improve Outcome: Progressing   Problem: Clinical Measurements: Goal: Ability to maintain clinical measurements within normal limits will improve Outcome: Progressing Goal: Will remain free from infection Outcome: Progressing Goal: Diagnostic test results will improve Outcome: Progressing Goal: Respiratory complications will improve Outcome: Progressing Goal: Cardiovascular complication will be avoided Outcome: Progressing   Problem: Activity: Goal: Risk for activity intolerance will decrease Outcome: Progressing   Problem: Nutrition: Goal: Adequate nutrition will be maintained Outcome: Progressing   Problem: Elimination: Goal: Will not experience complications related to bowel motility Outcome: Progressing Goal: Will not experience complications related to urinary retention Outcome: Progressing   Problem: Pain Managment: Goal: General experience of comfort will improve Outcome: Progressing   Problem: Safety: Goal: Ability to remain free from injury will improve Outcome: Progressing   Problem: Skin Integrity: Goal: Risk for impaired skin integrity will decrease Outcome: Progressing   

## 2019-03-18 NOTE — Progress Notes (Signed)
Called and spoke with patient's wife Arbie Cookey and updated her on patient's status this evening. Patient spoke with his wife briefly and both were thankful for phone call. All questions answered.

## 2019-03-18 NOTE — Progress Notes (Signed)
David Robinson for Lovenox Indication: pulmonary embolus  Not on File  Patient Measurements: Height: 6' (182.9 cm) Weight: 278 lb 10.6 oz (126.4 kg) IBW/kg (Calculated) : 77.6   Vital Signs: Temp: 98 F (36.7 C) (12/17 1200) Temp Source: Oral (12/17 1200) BP: 147/76 (12/17 1300) Pulse Rate: 83 (12/17 1300)  Labs: Recent Labs    03/16/19 0652 03/16/19 2249 03/17/19 0630 03/18/19 0645  HGB 15.8 14.3 15.1 13.9  HCT 46.9 42.0 43.8 41.2  PLT 201  --  214 209  CREATININE 0.92  --  0.85 0.89    Estimated Creatinine Clearance: 100 mL/min (by C-G formula based on SCr of 0.89 mg/dL).  Medications:  Scheduled:  . sodium chloride   Intravenous Once  . acetaminophen  650 mg Oral Once  . vitamin C  500 mg Oral Daily  . chlorhexidine  15 mL Mouth Rinse BID  . Chlorhexidine Gluconate Cloth  6 each Topical Daily  . dexamethasone (DECADRON) injection  6 mg Intravenous Q24H  . enoxaparin (LOVENOX) injection  1 mg/kg Subcutaneous Q12H  . furosemide  40 mg Intravenous BID  . insulin aspart  0-15 Units Subcutaneous TID WC  . insulin aspart  0-5 Units Subcutaneous QHS  . insulin aspart  2 Units Subcutaneous TID WC  . insulin glargine  20 Units Subcutaneous QHS  . Ipratropium-Albuterol  1 puff Inhalation Q6H  . mouth rinse  15 mL Mouth Rinse q12n4p  . sodium chloride flush  3 mL Intravenous Q12H  . sodium chloride flush  3 mL Intravenous Q12H  . zinc sulfate  220 mg Oral Daily    Assessment: 73 y/o male with a PMH significant for HTN and DM, presenting to [facility] with SOB on 12/14, has now been transferred to [facility] ICU. Pharmacy has been consulted to start therapeutic Lovenox d/t concern for PE with Ddimer >20. Patient's Scr is 1.68 and he is thought to have AKI, no CTa at this time.   12/16 LE dopplers + DVT CBC: stable, WNL SCr improved to 0.89 D-dimer is now decreasing:  > 20 , 15.83  Goal of Therapy:  Anti-Xa level 0.6-1 units/ml 4hrs after LMWH  dose given Monitor platelets by anticoagulation protocol: Yes   Plan:  Lovenox 1mg /kg (125 mg) SQ q12hr Monitor s/sx of bleeding, renal function  Gretta Arab PharmD, BCPS Clinical pharmacist phone 7am- 5pm: [phone] 03/18/2019 1:22 PM

## 2019-03-18 NOTE — Plan of Care (Signed)
  Problem: Education: Goal: Knowledge of risk factors and measures for prevention of condition will improve Outcome: Progressing   Problem: Coping: Goal: Psychosocial and spiritual needs will be supported Outcome: Progressing   Problem: Respiratory: Goal: Will maintain a patent airway Outcome: Progressing   Problem: Education: Goal: Knowledge of General Education information will improve Description: Including pain rating scale, medication(s)/side effects and non-pharmacologic comfort measures Outcome: Progressing   Problem: Health Behavior/Discharge Planning: Goal: Ability to manage health-related needs will improve Outcome: Progressing   Problem: Clinical Measurements: Goal: Will remain free from infection Outcome: Progressing Goal: Diagnostic test results will improve Outcome: Progressing Goal: Cardiovascular complication will be avoided Outcome: Progressing   Problem: Nutrition: Goal: Adequate nutrition will be maintained Outcome: Progressing   Problem: Coping: Goal: Level of anxiety will decrease Outcome: Progressing   Problem: Elimination: Goal: Will not experience complications related to bowel motility Outcome: Progressing Goal: Will not experience complications related to urinary retention Outcome: Progressing   Problem: Pain Managment: Goal: General experience of comfort will improve Outcome: Progressing   Problem: Safety: Goal: Ability to remain free from injury will improve Outcome: Progressing   Problem: Skin Integrity: Goal: Risk for impaired skin integrity will decrease Outcome: Progressing

## 2019-03-18 NOTE — Progress Notes (Signed)
PROGRESS NOTE  Gabriel Conry [employer] ZOX:[id number] DOB: [birth year] DOA: 03/17/2019 PCP: Burton Apley, MD   LOS: 3 days   Brief Narrative / Interim history: 73 year old male with type 2 diabetes mellitus, obese, came to the hospital with shortness of breath and was admitted on 03/31/2019.  He has been feeling sick for the past week and developed shortness of breath couple of days before coming to the emergency room.  He was so weak that he was barely able to walk.  He was admitted to the ICU due to increased oxygen requirements, and hospital course complicated by intermittent ICU delirium requiring Precedex  Subjective / 24h Interval events: Subjectively feels a little bit better today.  Remembers being confused yesterday and not knowing where he was.  States that his breathing is improved but he still feels quite anxious about everything that is going on and the fact that he is in the ICU  Assessment & Plan:  Principal Problem Acute Hypoxic Respiratory Failure due to Covid-19 Viral Illness -Remains profoundly hypoxic, continue to monitor in the ICU -Started on remdesivir 12/14 -Received plasma on 12/14 -Received Actemra on 12/14 and 12/15 as a second dose  Heated high flow: FiO2 (%):  [100 %] 100 %  O2: 40 L   -OK with mild hypoxemia, goal at rest is > 85% SaO2, with movement ideally > 75% -encouraged the patient to sit up in chair in the daytime use I-S and flutter valve for pulmonary toiletry and then prone in bed when at night. -Attempt to maintain negative balance, his kidney function is stable and he is net +750 cc, will give Lasix x 2 doses today, monitor fluid balance, renal function, will also give potassium  COVID-19 Labs  Recent Labs    03/18/2019 1513 03/16/19 0647 03/16/19 0652 03/16/19 1800 03/17/19 0630  DDIMER >20.00*  --  >20.00* >20.00* >20.00*  FERRITIN 595* 554*  --   --  567*  LDH 694*  --   --   --   --   CRP 22.2* 24.6*  --   --  11.1*    Lab Results    Component Value Date   SARSCOV2NAA POSITIVE (A) 03/16/2019    Active Problems Acute bilateral DVT, right peroneal veins and left gastrocnemius veins -Ultrasound also consistent with age-indeterminate superficial vein thrombosis of the right great saphenous vein -He is on full dose Lovenox, continue, he seems to be tolerating well, hemoglobin is stable and there is no evidence of bleeding  Acute metabolic encephalopathy / ICU delirium -Likely in the setting of ICU stay, patient has now history of EtOH abuse -mental status fluctuates but he is appropriate most of the time, continue as needed Precedex  Type 2 diabetes mellitus -Increase Lantus as well as admit time scheduled NovoLog given elevated CBGs, continue to monitor -CBGs fairly acceptable this morning  CBG (last 3)  Recent Labs    03/17/19 1601 03/17/19 2006 03/18/19 0738  GLUCAP 183* 217* 213*   Essential hypertension -Blood pressure stable  Acute kidney injury -Creatinine 1.68 on admission, this is now resolved, avoid nephrotoxic medications, monitor renal function while getting Lasix  Obesity -BMI 37, patient would benefit from weight loss  Scheduled Meds: . sodium chloride   Intravenous Once  . acetaminophen  650 mg Oral Once  . vitamin C  500 mg Oral Daily  . chlorhexidine  15 mL Mouth Rinse BID  . Chlorhexidine Gluconate Cloth  6 each Topical Daily  . dexamethasone (DECADRON) injection  6  mg Intravenous Q24H  . enoxaparin (LOVENOX) injection  1 mg/kg Subcutaneous Q12H  . insulin aspart  0-15 Units Subcutaneous TID WC  . insulin aspart  0-5 Units Subcutaneous QHS  . insulin aspart  2 Units Subcutaneous TID WC  . insulin glargine  20 Units Subcutaneous QHS  . Ipratropium-Albuterol  1 puff Inhalation Q6H  . mouth rinse  15 mL Mouth Rinse q12n4p  . sodium chloride flush  3 mL Intravenous Q12H  . sodium chloride flush  3 mL Intravenous Q12H  . zinc sulfate  220 mg Oral Daily   Continuous Infusions: .  sodium chloride    . dexmedetomidine (PRECEDEX) IV infusion 0.3 mcg/kg/hr (03/18/19 0400)  . dextrose 5 % and 0.9% NaCl 50 mL/hr at 03/18/19 0400  . remdesivir 100 mg in NS 100 mL Stopped (03/17/19 0958)   PRN Meds:.sodium chloride, acetaminophen, bisacodyl, chlorpheniramine-HYDROcodone, ondansetron **OR** ondansetron (ZOFRAN) IV, oxyCODONE, polyethylene glycol, sodium chloride flush, sodium phosphate  DVT prophylaxis: Lovenox Code Status: Full code Family Communication: wife Okey Regal [phone] Disposition Plan: home when ready   Consultants:  None   Procedures:  LE venous doppler 12/16 Summary: Right: Findings consistent with acute deep vein thrombosis involving the right peroneal veins. Findings consistent with age indeterminate superficial vein thrombosis involving the right great saphenous vein. No cystic structure found in the popliteal  fossa. Left: Findings consistent with acute deep vein thrombosis involving the left gastrocnemius veins. No cystic structure found in the popliteal fossa.  2D echo 12/15 IMPRESSIONS    1. Left ventricular ejection fraction, by visual estimation, is 60 to 65%. The left ventricle has normal function. There is mildly increased left ventricular hypertrophy.  2. Indeterminate diastolic filling due to E-A fusion.  3. The left ventricle has no regional wall motion abnormalities.  4. Global right ventricle has hyperdynamic systolic function.The right ventricular size is normal. No increase in right ventricular wall thickness.  5. Left atrial size was mildly dilated.  6. Right atrial size was normal.  7. Mild mitral annular calcification.  8. The mitral valve is normal in structure. No evidence of mitral valve regurgitation.  9. The tricuspid valve is normal in structure. Tricuspid valve regurgitation is not demonstrated. 10. The aortic valve is normal in structure. Aortic valve regurgitation is not visualized. 11. The pulmonic valve was not well  visualized. Pulmonic valve regurgitation is not visualized. 12. TR signal is inadequate for assessing pulmonary artery systolic pressure. 13. The inferior vena cava is normal in size with greater than 50% respiratory variability, suggesting right atrial pressure of 3 mmHg.   Microbiology: Blood cultures 12/14 - no growth   Antimicrobials: Remdesivir 12/14 >>  Objective: Vitals:   03/18/19 0300 03/18/19 0306 03/18/19 0400 03/18/19 0500  BP: 121/68  136/65 (!) 144/75  Pulse: (!) 58 71 (!) 59 63  Resp: (!) 21 (!) 24 (!) 26 (!) 22  Temp:   97.7 F (36.5 C)   TempSrc:   Axillary   SpO2: (!) 83% (!) 89% (!) 85% (!) 87%  Weight:      Height:        Intake/Output Summary (Last 24 hours) at 03/18/2019 0508 Last data filed at 03/18/2019 0400 Gross per 24 hour  Intake 2317.92 ml  Output 925 ml  Net 1392.92 ml   Filed Weights   03/26/2019 0913  Weight: 127 kg    Examination: Constitutional: Appears a bit anxious but no apparent distress Eyes: No scleral icterus seen ENMT: Moist mucous membranes Neck: normal, supple  Respiratory: Diffuse bibasilar rhonchi, no wheezing, increased respiratory effort, tachypneic Cardiovascular: Regular rate and rhythm, no murmurs, no peripheral edema Abdomen: Soft, NT, ND, bowel sounds positive Musculoskeletal: no clubbing / cyanosis.  Skin: No rashes appreciated Neurologic: No focal deficits, equal strength   Data Reviewed: I have independently reviewed following labs and imaging studies   CBC: Recent Labs  Lab 13-Apr-2019 0922 04-13-2019 1149 03/16/19 0652 03/16/19 2249 03/17/19 0630  WBC 7.0  --  6.2  --  14.3*  NEUTROABS 6.2  --  5.2  --  12.2*  HGB 15.2 13.9 15.8 14.3 15.1  HCT 44.0 41.0 46.9 42.0 43.8  MCV 91.5  --  91.4  --  92.2  PLT 207  --  201  --  269   Basic Metabolic Panel: Recent Labs  Lab 04/13/2019 0922 13-Apr-2019 1149 03/16/19 0652 03/16/19 2249 03/17/19 0630  NA 136 133* 139 137 139  K 4.1 3.8 4.0 3.8 4.1  CL 98  --   102  --  102  CO2 22  --  23  --  25  GLUCOSE 185*  --  234*  --  230*  BUN 38*  --  35*  --  43*  CREATININE 1.68*  --  0.92  --  0.85  CALCIUM 8.3*  --  8.6*  --  8.6*  MG  --   --   --   --  1.8  PHOS  --   --   --   --  2.0*   GFR: Estimated Creatinine Clearance: 105 mL/min (by C-G formula based on SCr of 0.85 mg/dL). Liver Function Tests: Recent Labs  Lab 04-13-19 0922 03/16/19 0652 03/17/19 0630  AST 63* 68* 62*  ALT 21 23 20   ALKPHOS 77 96 121  BILITOT 1.1 0.9 0.6  PROT 6.2* 6.7 6.1*  ALBUMIN 2.7* 2.7* 2.6*   No results for input(s): LIPASE, AMYLASE in the last 168 hours. No results for input(s): AMMONIA in the last 168 hours. Coagulation Profile: No results for input(s): INR, PROTIME in the last 168 hours. Cardiac Enzymes: No results for input(s): CKTOTAL, CKMB, CKMBINDEX, TROPONINI in the last 168 hours. BNP (last 3 results) No results for input(s): PROBNP in the last 8760 hours. HbA1C: Recent Labs    04/13/19 1513  HGBA1C 6.6*   CBG: Recent Labs  Lab 03/16/19 2127 03/17/19 0758 03/17/19 1157 03/17/19 1601 03/17/19 2006  GLUCAP 263* 246* 211* 183* 217*   Lipid Profile: No results for input(s): CHOL, HDL, LDLCALC, TRIG, CHOLHDL, LDLDIRECT in the last 72 hours. Thyroid Function Tests: No results for input(s): TSH, T4TOTAL, FREET4, T3FREE, THYROIDAB in the last 72 hours. Anemia Panel: Recent Labs    03/16/19 0647 03/17/19 0630  FERRITIN 554* 567*   Urine analysis: No results found for: COLORURINE, APPEARANCEUR, LABSPEC, PHURINE, GLUCOSEU, HGBUR, BILIRUBINUR, KETONESUR, PROTEINUR, UROBILINOGEN, NITRITE, LEUKOCYTESUR Sepsis Labs: Invalid input(s): PROCALCITONIN, LACTICIDVEN  Recent Results (from the past 240 hour(s))  Blood culture (routine x 2)     Status: None (Preliminary result)   Collection Time: 04-13-2019  9:22 AM   Specimen: BLOOD  Result Value Ref Range Status   Specimen Description BLOOD LEFT ANTECUBITAL  Final   Special Requests    Final    BOTTLES DRAWN AEROBIC AND ANAEROBIC Blood Culture adequate volume   Culture   Final    NO GROWTH 2 DAYS Performed at [facility], [address] [place], [place] [address]    Report Status PENDING  Incomplete  Respiratory Panel by RT PCR (Flu A&B, Covid) - Nasopharyngeal Swab     Status: Abnormal   Collection Time: 03/26/2019 10:52 AM   Specimen: Nasopharyngeal Swab  Result Value Ref Range Status   SARS Coronavirus 2 by RT PCR POSITIVE (A) NEGATIVE Final    Comment: RESULT CALLED TO, READ BACK BY AND VERIFIED WITH: Dallie Piles RN 12:40 03/18/2019 ([email or web address]) (NOTE) SARS-CoV-2 target nucleic acids are DETECTED. SARS-CoV-2 RNA is generally detectable in upper respiratory specimens  during the acute phase of infection. Positive results are indicative of the presence of the identified virus, but do not rule out bacterial infection or co-infection with other pathogens not detected by the test. Clinical correlation with patient history and other diagnostic information is necessary to determine patient infection status. The expected result is Negative. Fact Sheet for Patients:  https://www.moore.com/ Fact Sheet for Healthcare Providers: https://www.young.biz/ This test is not yet approved or cleared by the [place] FDA and  has been authorized for detection and/or diagnosis of SARS-CoV-2 by FDA under an Emergency Use Authorization (EUA).  This EUA will remain in effect (meaning this test can be used)  for the duration of  the COVID-19 declaration under Section 564(b)(1) of the Act, 21 U.S.C. section 360bbb-3(b)(1), unless the authorization is terminated or revoked sooner.    Influenza A by PCR NEGATIVE NEGATIVE Final   Influenza B by PCR NEGATIVE NEGATIVE Final    Comment: (NOTE) The Xpert Xpress SARS-CoV-2/FLU/RSV assay is intended as an aid in  the diagnosis of influenza from Nasopharyngeal swab specimens and  should not be used as  a sole basis for treatment. Nasal washings and  aspirates are unacceptable for Xpert Xpress SARS-CoV-2/FLU/RSV  testing. Fact Sheet for Patients: https://www.moore.com/ Fact Sheet for Healthcare Providers: https://www.young.biz/ This test is not yet approved or cleared by the [place] FDA and  has been authorized for detection and/or diagnosis of SARS-CoV-2 by  FDA under an Emergency Use Authorization (EUA). This EUA will remain  in effect (meaning this test can be used) for the duration of the  Covid-19 declaration under Section 564(b)(1) of the Act, 21  U.S.C. section 360bbb-3(b)(1), unless the authorization is  terminated or revoked. Performed at [facility], [address], [place], [place] [address]   Culture, blood (Routine X 2) w Reflex to ID Panel     Status: None (Preliminary result)   Collection Time: 03/13/2019  3:13 PM   Specimen: BLOOD RIGHT HAND  Result Value Ref Range Status   Specimen Description BLOOD RIGHT HAND  Final   Special Requests   Final    BOTTLES DRAWN AEROBIC ONLY Blood Culture results may not be optimal due to an inadequate volume of blood received in culture bottles   Culture   Final    NO GROWTH 2 DAYS Performed at [facility], [address] [place], [place] [address]    Report Status PENDING  Incomplete  MRSA PCR Screening     Status: None   Collection Time: 03/16/19  3:00 AM   Specimen: Nasopharyngeal  Result Value Ref Range Status   MRSA by PCR NEGATIVE NEGATIVE Final    Comment:        The GeneXpert MRSA Assay (FDA approved for NASAL specimens only), is one component of a comprehensive MRSA colonization surveillance program. It is not intended to diagnose MRSA infection nor to guide or monitor treatment for MRSA infections. Performed at [facility], [address]  [place], [place] [address]       Radiology Studies: ECHOCARDIOGRAM COMPLETE  Result Date:  03/16/2019   ECHOCARDIOGRAM REPORT   Patient Name:   DEROLD DORSCH [facility] Date of Exam: 03/16/2019 Medical Rec #:  [id number]       Height:       72.0 in Accession #:    [id number]      Weight:       280.0 lb Date of Birth:  [birth year]       BSA:          2.46 m Patient Age:    73 years        BP:           77/47 mmHg Patient Gender: M               HR:           93 bpm. Exam Location:  Inpatient Procedure: 2D Echo, Cardiac Doppler and Color Doppler Indications:    Dyspnea  History:        Patient has no prior history of Echocardiogram examinations.                 Signs/Symptoms:Chest Pain and Shortness of Breath; Risk                 Factors:Hypertension and Diabetes. Covid positive.  Sonographer:    Lavenia Atlas Referring Phys: [phone] [facility]  Sonographer Comments: Image acquisition challenging due to respiratory motion. IMPRESSIONS  1. Left ventricular ejection fraction, by visual estimation, is 60 to 65%. The left ventricle has normal function. There is mildly increased left ventricular hypertrophy.  2. Indeterminate diastolic filling due to E-A fusion.  3. The left ventricle has no regional wall motion abnormalities.  4. Global right ventricle has hyperdynamic systolic function.The right ventricular size is normal. No increase in right ventricular wall thickness.  5. Left atrial size was mildly dilated.  6. Right atrial size was normal.  7. Mild mitral annular calcification.  8. The mitral valve is normal in structure. No evidence of mitral valve regurgitation.  9. The tricuspid valve is normal in structure. Tricuspid valve regurgitation is not demonstrated. 10. The aortic valve is normal in structure. Aortic valve regurgitation is not visualized. 11. The pulmonic valve was not well visualized. Pulmonic valve regurgitation is not visualized. 12. TR signal is inadequate for assessing pulmonary artery systolic pressure. 13. The inferior vena cava is normal in size with greater than 50% respiratory  variability, suggesting right atrial pressure of 3 mmHg. FINDINGS  Left Ventricle: Left ventricular ejection fraction, by visual estimation, is 60 to 65%. The left ventricle has normal function. The left ventricle has no regional wall motion abnormalities. The left ventricular internal cavity size was the left ventricle is normal in size. There is mildly increased left ventricular hypertrophy. Concentric left ventricular hypertrophy. Indeterminate diastolic filling due to E-A fusion. Right Ventricle: The right ventricular size is normal. No increase in right ventricular wall thickness. Global RV systolic function is has hyperdynamic systolic function. Left Atrium: Left atrial size was mildly dilated. Right Atrium: Right atrial size was normal in size Pericardium: There is no evidence of pericardial effusion. Mitral Valve: The mitral valve is normal in structure. Mild mitral annular calcification. No evidence of mitral valve regurgitation. Tricuspid Valve: The tricuspid valve is normal in structure. Tricuspid valve regurgitation is not demonstrated. Aortic Valve: The aortic valve is normal in structure. Aortic valve regurgitation is not visualized. Pulmonic Valve: The  pulmonic valve was not well visualized. Pulmonic valve regurgitation is not visualized. Pulmonic regurgitation is not visualized. Aorta: The aortic root is normal in size and structure. Venous: The inferior vena cava is normal in size with greater than 50% respiratory variability, suggesting right atrial pressure of 3 mmHg. IAS/Shunts: No atrial level shunt detected by color flow Doppler.  LEFT VENTRICLE PLAX 2D LVIDd:         4.35 cm  Diastology LVIDs:         3.00 cm  LV e' lateral:   11.50 cm/s LV PW:         1.39 cm  LV E/e' lateral: 10.9 LV IVS:        1.43 cm  LV e' medial:    11.00 cm/s LVOT diam:     2.20 cm  LV E/e' medial:  11.4 LV SV:         50 ml LV SV Index:   19.45 LVOT Area:     3.80 cm  RIGHT VENTRICLE RV Basal diam:  3.55 cm RV S  prime:     15.30 cm/s TAPSE (M-mode): 3.9 cm LEFT ATRIUM             Index       RIGHT ATRIUM           Index LA diam:        4.10 cm 1.67 cm/m  RA Area:     23.30 cm LA Vol (A2C):   60.0 ml 24.41 ml/m RA Volume:   77.20 ml  31.41 ml/m LA Vol (A4C):   92.2 ml 37.51 ml/m LA Biplane Vol: 75.5 ml 30.71 ml/m  AORTIC VALVE LVOT Vmax:   125.00 cm/s LVOT Vmean:  82.900 cm/s LVOT VTI:    0.201 m  AORTA Ao Root diam: 3.10 cm MITRAL VALVE MV Area (PHT): 4.71 cm             SHUNTS MV PHT:        46.69 msec           Systemic VTI:  0.20 m MV Decel Time: 161 msec             Systemic Diam: 2.20 cm MV E velocity: 125.00 cm/s 103 cm/s  Mihai Croitoru MD Electronically signed by Thurmon Fair MD Signature Date/Time: 03/16/2019/11:22:34 AM    Final    VAS [place] LOWER EXTREMITY VENOUS (DVT)  Result Date: 03/17/2019  Lower Venous Study Indications: Elevated Ddimer.  Risk Factors: COVID 19 positive. Limitations: Poor ultrasound/tissue interface, body habitus and patient movement, patient positioning. Comparison Study: No prior studies. Performing Technologist: Chanda Busing RVT  Examination Guidelines: A complete evaluation includes B-mode imaging, spectral Doppler, color Doppler, and power Doppler as needed of all accessible portions of each vessel. Bilateral testing is considered an integral part of a complete examination. Limited examinations for reoccurring indications may be performed as noted.  +---------+---------------+---------+-----------+----------+-----------------+ RIGHT    CompressibilityPhasicitySpontaneityPropertiesThrombus Aging    +---------+---------------+---------+-----------+----------+-----------------+ CFV      Full           Yes      Yes                                    +---------+---------------+---------+-----------+----------+-----------------+ SFJ      Full                                                            +---------+---------------+---------+-----------+----------+-----------------+  FV Prox  Full                                                           +---------+---------------+---------+-----------+----------+-----------------+ FV Mid   Full                                                           +---------+---------------+---------+-----------+----------+-----------------+ FV DistalFull                                                           +---------+---------------+---------+-----------+----------+-----------------+ PFV      Full                                                           +---------+---------------+---------+-----------+----------+-----------------+ POP      Full           Yes      Yes                                    +---------+---------------+---------+-----------+----------+-----------------+ PTV      Full                                                           +---------+---------------+---------+-----------+----------+-----------------+ PERO     None                                         Acute             +---------+---------------+---------+-----------+----------+-----------------+ GSV      Partial                                      Age Indeterminate +---------+---------------+---------+-----------+----------+-----------------+   +---------+---------------+---------+-----------+----------+--------------+ LEFT     CompressibilityPhasicitySpontaneityPropertiesThrombus Aging +---------+---------------+---------+-----------+----------+--------------+ CFV      Full           Yes      Yes                                 +---------+---------------+---------+-----------+----------+--------------+ SFJ      Full                                                        +---------+---------------+---------+-----------+----------+--------------+  FV Prox  Full                                                         +---------+---------------+---------+-----------+----------+--------------+ FV Mid   Full                                                        +---------+---------------+---------+-----------+----------+--------------+ FV DistalFull                                                        +---------+---------------+---------+-----------+----------+--------------+ PFV      Full                                                        +---------+---------------+---------+-----------+----------+--------------+ POP      Full           Yes      Yes                                 +---------+---------------+---------+-----------+----------+--------------+ PTV      Full                                                        +---------+---------------+---------+-----------+----------+--------------+ PERO                                                  Not visualized +---------+---------------+---------+-----------+----------+--------------+ Gastroc  None                                         Acute          +---------+---------------+---------+-----------+----------+--------------+     Summary: Right: Findings consistent with acute deep vein thrombosis involving the right peroneal veins. Findings consistent with age indeterminate superficial vein thrombosis involving the right great saphenous vein. No cystic structure found in the popliteal fossa. Left: Findings consistent with acute deep vein thrombosis involving the left gastrocnemius veins. No cystic structure found in the popliteal fossa.  *See table(s) above for measurements and observations. Electronically signed by Sherald Hesshristopher Clark MD on 03/17/2019 at 12:53:34 PM.    Final     Pamella Pertostin Anona Giovannini, MD, PhD [facility]  Contact via  [email or web address]  [facility] Office Info P: [phone] F: [phone]

## 2019-03-19 ENCOUNTER — Encounter (HOSPITAL_COMMUNITY): Payer: Self-pay | Admitting: Internal Medicine

## 2019-03-19 LAB — FERRITIN: Ferritin: 753 ng/mL — ABNORMAL HIGH (ref 24–336)

## 2019-03-19 LAB — COMPREHENSIVE METABOLIC PANEL
ALT: 24 U/L (ref 0–44)
AST: 74 U/L — ABNORMAL HIGH (ref 15–41)
Albumin: 2.8 g/dL — ABNORMAL LOW (ref 3.5–5.0)
Alkaline Phosphatase: 259 U/L — ABNORMAL HIGH (ref 38–126)
Anion gap: 11 (ref 5–15)
BUN: 42 mg/dL — ABNORMAL HIGH (ref 8–23)
CO2: 27 mmol/L (ref 22–32)
Calcium: 8.5 mg/dL — ABNORMAL LOW (ref 8.9–10.3)
Chloride: 101 mmol/L (ref 98–111)
Creatinine, Ser: 0.85 mg/dL (ref 0.61–1.24)
GFR calc Af Amer: >60 mL/min (ref >60)
GFR calc non Af Amer: >60 mL/min (ref >60)
Glucose, Bld: 166 mg/dL — ABNORMAL HIGH (ref 70–99)
Potassium: 4 mmol/L (ref 3.5–5.1)
Sodium: 139 mmol/L (ref 135–145)
Total Bilirubin: 1 mg/dL (ref 0.3–1.2)
Total Protein: 6.1 g/dL — ABNORMAL LOW (ref 6.5–8.1)

## 2019-03-19 LAB — CBC WITH DIFFERENTIAL/PLATELET
Abs Immature Granulocytes: 0.09 10*3/uL — ABNORMAL HIGH (ref 0.00–0.07)
Basophils Absolute: 0 10*3/uL (ref 0.0–0.1)
Basophils Relative: 0 %
Eosinophils Absolute: 0.1 10*3/uL (ref 0.0–0.5)
Eosinophils Relative: 1 %
HCT: 45.3 % (ref 39.0–52.0)
Hemoglobin: 15.3 g/dL (ref 13.0–17.0)
Immature Granulocytes: 1 %
Lymphocytes Relative: 8 %
Lymphs Abs: 0.6 10*3/uL — ABNORMAL LOW (ref 0.7–4.0)
MCH: 30.7 pg (ref 26.0–34.0)
MCHC: 33.8 g/dL (ref 30.0–36.0)
MCV: 90.8 fL (ref 80.0–100.0)
Monocytes Absolute: 0.2 10*3/uL (ref 0.1–1.0)
Monocytes Relative: 3 %
Neutro Abs: 6.7 10*3/uL (ref 1.7–7.7)
Neutrophils Relative %: 87 %
Platelets: 193 10*3/uL (ref 150–400)
RBC: 4.99 MIL/uL (ref 4.22–5.81)
RDW: 14.6 % (ref 11.5–15.5)
WBC: 7.7 10*3/uL (ref 4.0–10.5)
nRBC: 0.4 % — ABNORMAL HIGH (ref 0.0–0.2)

## 2019-03-19 LAB — C-REACTIVE PROTEIN: CRP: 3.5 mg/dL — ABNORMAL HIGH (ref <1.0)

## 2019-03-19 LAB — PHOSPHORUS: Phosphorus: 2.9 mg/dL (ref 2.5–4.6)

## 2019-03-19 LAB — D-DIMER, QUANTITATIVE: D-Dimer, Quant: 12.59 ug/mL-FEU — ABNORMAL HIGH (ref 0.00–0.50)

## 2019-03-19 LAB — MAGNESIUM: Magnesium: 1.7 mg/dL (ref 1.7–2.4)

## 2019-03-19 LAB — GLUCOSE, CAPILLARY
Glucose-Capillary: 147 mg/dL — ABNORMAL HIGH (ref 70–99)
Glucose-Capillary: 172 mg/dL — ABNORMAL HIGH (ref 70–99)

## 2019-03-19 MED ORDER — METHYLPREDNISOLONE SODIUM SUCC 125 MG IJ SOLR
60.0000 mg | Freq: Two times a day (BID) | INTRAMUSCULAR | Status: DC
  Administered 2019-03-19 – 2019-03-20 (×3): 60 mg via INTRAVENOUS
  Filled 2019-03-19 (×4): qty 2

## 2019-03-19 MED ORDER — FUROSEMIDE 10 MG/ML IJ SOLN
40.0000 mg | Freq: Once | INTRAMUSCULAR | Status: AC
  Administered 2019-03-19: 40 mg via INTRAVENOUS
  Filled 2019-03-19: qty 4

## 2019-03-19 NOTE — Plan of Care (Signed)

## 2019-03-19 NOTE — Progress Notes (Addendum)
PROGRESS NOTE    David Robinson [facility]  WUJ:[id number] DOB: 01-07-1946 DOA: Mar 27, 2019 PCP: Burton Apley, MD   Brief Narrative:  73 year old male with type 2 diabetes mellitus, obese, came to the hospital with shortness of breath and was admitted on 03-27-2019.  He has been feeling sick for the past week and developed shortness of breath couple of days before coming to the emergency room.  He was so weak that he was barely able to walk.  He was admitted to the ICU due to increased oxygen requirements, and hospital course complicated by intermittent ICU delirium requiring Precedex.  Assessment & Plan:   Principal Problem:   Acute respiratory failure with hypoxia (HCC) Active Problems:   Acute respiratory distress syndrome (ARDS) due to COVID-19 virus (HCC)   Diabetes mellitus type 2 in obese [facility])   Essential hypertension   Pneumonia due to COVID-19 virus   Diabetes mellitus type 2, controlled, with complications (HCC)   Obese  Acute Hypoxic Respiratory Failure  COVID 19 Pneumonia:  Continues to require significant O2 requirement - on 40 L [place] with NRB Continue remdesivir, steroids S/p plasma, actemra I/O, daily weights Goal O2 at rest >8%% and >75% with movement I/O, daily weights - continue lasix as tolerated OOB, IS He's on precedex intermittently to help with agitation and anxiety.  Will continue for now.    COVID-19 Labs  Recent Labs    03/17/19 0630 03/18/19 0645 03/19/19 0250  DDIMER >20.00* 15.83* 12.59*  FERRITIN 567* 638* 753*  CRP 11.1* 5.9* 3.5*    Lab Results  Component Value Date   SARSCOV2NAA POSITIVE (Doris Mcgilvery) 03/27/2019   Acute bilateral DVT, right peroneal veins and left gastrocnemius veins -Ultrasound also consistent with age-indeterminate superficial vein thrombosis of the right great saphenous vein -He is on full dose Lovenox, continue, he seems to be tolerating well, hemoglobin is stable and there is no evidence of bleeding -obtain CTA when able - echo  with hyperdynamic RV systolic function, normal EF  Acute metabolic encephalopathy / ICU delirium -Likely in the setting of ICU stay, patient has now history of EtOH abuse -mental status fluctuates but he is appropriate most of the time, continue as needed Precedex  Type 2 diabetes mellitus -Increase Lantus as well as admit time scheduled NovoLog given elevated CBGs, continue to monitor -CBGs fairly acceptable this morning, follow  Essential hypertension -Blood pressure stable  Acute kidney injury -Creatinine 1.68 on admission, this is now resolved, avoid nephrotoxic medications, monitor renal function while getting Lasix  Obesity -BMI 37, patient would benefit from weight loss  DVT prophylaxis: lovenox Code Status: full  Family Communication: none at bedside - discussed with wife Disposition Plan: pending further improvement  Consultants:   none  Procedures:  LE [place] Summary: Right: Findings consistent with acute deep vein thrombosis involving the right peroneal veins. Findings consistent with age indeterminate superficial vein thrombosis involving the right great saphenous vein. No cystic structure found in the popliteal  fossa. Left: Findings consistent with acute deep vein thrombosis involving the left gastrocnemius veins. No cystic structure found in the popliteal fossa.  Echo IMPRESSIONS    1. Left ventricular ejection fraction, by visual estimation, is 60 to 65%. The left ventricle has normal function. There is mildly increased left ventricular hypertrophy.  2. 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  3. The left ventricle has no regional wall motion abnormalities.  4. Global right ventricle has hyperdynamic systolic function.The right ventricular size is normal. No increase in right ventricular  wall thickness.  5. Left atrial size was mildly dilated.  6. Right atrial size was normal.  7. Mild mitral annular calcification.  8. The mitral valve is  normal in structure. No evidence of mitral valve regurgitation.  9. The tricuspid valve is normal in structure. Tricuspid valve regurgitation is not demonstrated. 10. The aortic valve is normal in structure. Aortic valve regurgitation is not visualized. 11. The pulmonic valve was not well visualized. Pulmonic valve regurgitation is not visualized. 12. TR signal is inadequate for assessing pulmonary artery systolic pressure. 13. The inferior vena cava is normal in size with greater than 50% respiratory variability, suggesting right atrial pressure of 3 mmHg.  Antimicrobials:  Anti-infectives (From admission, onward)   Start     Dose/Rate Route Frequency Ordered Stop   03/16/19 1500  cefTRIAXone (ROCEPHIN) 2 g in sodium chloride 0.9 % 100 mL IVPB  Status:  Discontinued     2 g 200 mL/hr over 30 Minutes Intravenous Every 24 hours 03/03/2019 1723 03/16/2019 1900   03/16/19 1100  azithromycin (ZITHROMAX) 500 mg in sodium chloride 0.9 % 250 mL IVPB  Status:  Discontinued     500 mg 250 mL/hr over 60 Minutes Intravenous Every 24 hours 03/05/2019 1723 03/21/2019 1900   03/16/19 1000  remdesivir 100 mg in sodium chloride 0.9 % 100 mL IVPB     100 mg 200 mL/hr over 30 Minutes Intravenous Daily 03/13/2019 1325 03/19/19 0929   03/24/2019 1745  cefTRIAXone (ROCEPHIN) 1 g in sodium chloride 0.9 % 100 mL IVPB  Status:  Discontinued     1 g 200 mL/hr over 30 Minutes Intravenous  Once 03/02/2019 1744 03/11/2019 1905   03/02/2019 1330  remdesivir 200 mg in sodium chloride 0.9% 250 mL IVPB     200 mg 580 mL/hr over 30 Minutes Intravenous Once 03/08/2019 1325 03/03/2019 1603   03/29/2019 1015  cefTRIAXone (ROCEPHIN) 1 g in sodium chloride 0.9 % 100 mL IVPB     1 g 200 mL/hr over 30 Minutes Intravenous  Once 03/29/2019 1013 03/03/2019 1337   03/11/2019 1015  azithromycin (ZITHROMAX) 500 mg in sodium chloride 0.9 % 250 mL IVPB     500 mg 250 mL/hr over 60 Minutes Intravenous  Once 04/01/2019 1013 03/21/2019 1248     Subjective: C/o  anxiety and SOB  Objective: Vitals:   03/19/19 0852 03/19/19 0900 03/19/19 1000 03/19/19 1100  BP: 119/64 (!) 138/100 (!) 141/87 (!) 121/58  Pulse: 61 62 85 69  Resp: (!) 30 (!) 30 (!) 24 (!) 26  Temp:      TempSrc:      SpO2: 90% (!) 89% (!) 89% (!) 83%  Weight:      Height:        Intake/Output Summary (Last 24 hours) at 03/19/2019 1400 Last data filed at 03/19/2019 0800 Gross per 24 hour  Intake 1117.75 ml  Output 1450 ml  Net -332.25 ml   Filed Weights   03/19/2019 0913 03/18/19 0500 03/19/19 0500  Weight: 127 kg 126.4 kg 126.9 kg    Examination:  General exam: Appears anxious  Respiratory system: diminished breath sounds, tachypneic Cardiovascular system: RRR Gastrointestinal system: Abdomen is nondistended, soft and nontender. Central nervous system: Alert and oriented. No focal neurological deficits. Extremities: no LEE Skin: No rashes, lesions or ulcers Psychiatry: Judgement and insight appear normal. Mood & affect appropriate.     Data Reviewed: I have personally reviewed following labs and imaging studies  CBC: Recent Labs  Lab 03/19/2019  4098 03/16/19 1191 03/16/19 2249 03/17/19 0630 03/18/19 0645 03/19/19 0250  WBC 7.0 6.2  --  14.3* 9.9 7.7  NEUTROABS 6.2 5.2  --  12.2* 8.6* 6.7  HGB 15.2 15.8 14.3 15.1 13.9 15.3  HCT 44.0 46.9 42.0 43.8 41.2 45.3  MCV 91.5 91.4  --  92.2 92.4 90.8  PLT 207 201  --  214 209 193   Basic Metabolic Panel: Recent Labs  Lab 03/18/2019 0922 03/16/19 0652 03/16/19 2249 03/17/19 0630 03/18/19 0645 03/19/19 0250  NA 136 139 137 139 139 139  K 4.1 4.0 3.8 4.1 3.8 4.0  CL 98 102  --  102 100 101  CO2 22 23  --  GLUCOSE 185* 234*  --  230* 224* 166*  BUN 38* 35*  --  43* 44* 42*  CREATININE 1.68* 0.92  --  0.85 0.89 0.85  CALCIUM 8.3* 8.6*  --  8.6* 8.4* 8.5*  MG  --   --   --  1.8 1.8 1.7  PHOS  --   --   --  2.0* 2.6 2.9   GFR: Estimated Creatinine Clearance: 104.9 mL/min (by C-G formula based on  SCr of 0.85 mg/dL). Liver Function Tests: Recent Labs  Lab 03/02/2019 0922 03/16/19 0652 03/17/19 0630 03/18/19 0645 03/19/19 0250  AST 63* 68* 62* 66* 74*  ALT ALKPHOS 77 96 121 197* 259*  BILITOT 1.1 0.9 0.6 0.8 1.0  PROT 6.2* 6.7 6.1* 6.0* 6.1*  ALBUMIN 2.7* 2.7* 2.6* 2.6* 2.8*   No results for input(s): LIPASE, AMYLASE in the last 168 hours. No results for input(s): AMMONIA in the last 168 hours. Coagulation Profile: No results for input(s): INR, PROTIME in the last 168 hours. Cardiac Enzymes: No results for input(s): CKTOTAL, CKMB, CKMBINDEX, TROPONINI in the last 168 hours. BNP (last 3 results) No results for input(s): PROBNP in the last 8760 hours. HbA1C: No results for input(s): HGBA1C in the last 72 hours. CBG: Recent Labs  Lab 03/18/19 0738 03/18/19 1140 03/18/19 1614 03/18/19 2106 03/19/19 0806  GLUCAP 213* 205* 172* 202* 147*   Lipid Profile: No results for input(s): CHOL, HDL, LDLCALC, TRIG, CHOLHDL, LDLDIRECT in the last 72 hours. Thyroid Function Tests: No results for input(s): TSH, T4TOTAL, FREET4, T3FREE, THYROIDAB in the last 72 hours. Anemia Panel: Recent Labs    03/18/19 0645 03/19/19 0250  FERRITIN 638* 753*   Sepsis Labs: Recent Labs  Lab 03/25/2019 0923 03/30/2019 1513  PROCALCITON  --  0.18  LATICACIDVEN 2.6*  --     Recent Results (from the past 240 hour(s))  Blood culture (routine x 2)     Status: None (Preliminary result)   Collection Time: 03/16/2019  9:22 AM   Specimen: BLOOD  Result Value Ref Range Status   Specimen Description BLOOD LEFT ANTECUBITAL  Final   Special Requests   Final    BOTTLES DRAWN AEROBIC AND ANAEROBIC Blood Culture adequate volume   Culture   Final    NO GROWTH 3 DAYS Performed at [facility], [address], [place], [place] [address]    Report Status PENDING  Incomplete  Respiratory Panel by RT PCR (Flu Shonita Rinck&B, Covid) - Nasopharyngeal Swab     Status: Abnormal   Collection Time:  03/14/2019 10:52 AM   Specimen: Nasopharyngeal Swab  Result Value Ref Range Status   SARS Coronavirus 2 by RT PCR POSITIVE (Areebah Meinders) NEGATIVE Final    Comment: RESULT  CALLED TO, READ BACK BY AND VERIFIED WITH: Dallie Piles. Grose RN 12:40 02-Aug-2018 ([email or web address]) (NOTE) SARS-CoV-2 target nucleic acids are DETECTED. SARS-CoV-2 RNA is generally detectable in upper respiratory specimens  during the acute phase of infection. Positive results are indicative of the presence of the identified virus, but do not rule out bacterial infection or co-infection with other pathogens not detected by the test. Clinical correlation with patient history and other diagnostic information is necessary to determine patient infection status. The expected result is Negative. Fact Sheet for Patients:  https://www.moore.com/https://www.fda.gov/media/142436/download Fact Sheet for Healthcare Providers: https://www.young.biz/https://www.fda.gov/media/142435/download This test is not yet approved or cleared by the [place] FDA and  has been authorized for detection and/or diagnosis of SARS-CoV-2 by FDA under an Emergency Use Authorization (EUA).  This EUA will remain in effect (meaning this test can be used)  for the duration of  the COVID-19 declaration under Section 564(b)(1) of the Act, 21 U.S.C. section 360bbb-3(b)(1), unless the authorization is terminated or revoked sooner.    Influenza Nimrod Wendt by PCR NEGATIVE NEGATIVE Final   Influenza B by PCR NEGATIVE NEGATIVE Final    Comment: (NOTE) The Xpert Xpress SARS-CoV-2/FLU/RSV assay is intended as an aid in  the diagnosis of influenza from Nasopharyngeal swab specimens and  should not be used as Nickoli Bagheri sole basis for treatment. Nasal washings and  aspirates are unacceptable for Xpert Xpress SARS-CoV-2/FLU/RSV  testing. Fact Sheet for Patients: https://www.moore.com/https://www.fda.gov/media/142436/download Fact Sheet for Healthcare Providers: https://www.young.biz/https://www.fda.gov/media/142435/download This test is not yet approved or cleared by the [place] FDA  and  has been authorized for detection and/or diagnosis of SARS-CoV-2 by  FDA under an Emergency Use Authorization (EUA). This EUA will remain  in effect (meaning this test can be used) for the duration of the  Covid-19 declaration under Section 564(b)(1) of the Act, 21  U.S.C. section 360bbb-3(b)(1), unless the authorization is  terminated or revoked. Performed at [facility], [address] [place], [place] [address]   Culture, blood (Routine X 2) w Reflex to ID Panel     Status: None (Preliminary result)   Collection Time: 02-Aug-2018  3:13 PM   Specimen: BLOOD RIGHT HAND  Result Value Ref Range Status   Specimen Description BLOOD RIGHT HAND  Final   Special Requests   Final    BOTTLES DRAWN AEROBIC ONLY Blood Culture results may not be optimal due to an inadequate volume of blood received in culture bottles   Culture   Final    NO GROWTH 3 DAYS Performed at [facility], [address] [place], [place] [address]    Report Status PENDING  Incomplete  MRSA PCR Screening     Status: None   Collection Time: 03/16/19  3:00 AM   Specimen: Nasopharyngeal  Result Value Ref Range Status   MRSA by PCR NEGATIVE NEGATIVE Final    Comment:        The GeneXpert MRSA Assay (FDA approved for NASAL specimens only), is one component of Phyllis Abelson comprehensive MRSA colonization surveillance program. It is not intended to diagnose MRSA infection nor to guide or monitor treatment for MRSA infections. Performed at [facility], [address] [place], [place] [address]          Radiology Studies: No results found.      Scheduled Meds: . acetaminophen  650 mg Oral Once  . vitamin C  500 mg Oral Daily  . busPIRone  5 mg Oral BID  . chlorhexidine  15 mL  Mouth Rinse BID  . Chlorhexidine Gluconate Cloth  6 each Topical Daily  . dexamethasone (DECADRON) injection  6 mg Intravenous Q24H  . enoxaparin (LOVENOX) injection  1 mg/kg Subcutaneous Q12H  . insulin  aspart  0-15 Units Subcutaneous TID WC  . insulin aspart  0-5 Units Subcutaneous QHS  . insulin aspart  2 Units Subcutaneous TID WC  . insulin glargine  20 Units Subcutaneous QHS  . Ipratropium-Albuterol  1 puff Inhalation Q6H  . mouth rinse  15 mL Mouth Rinse q12n4p  . sodium chloride flush  3 mL Intravenous Q12H  . sodium chloride flush  3 mL Intravenous Q12H  . zinc sulfate  220 mg Oral Daily   Continuous Infusions: . sodium chloride    . dexmedetomidine (PRECEDEX) IV infusion 0.3 mcg/kg/hr (03/19/19 1039)     LOS: 4 days    Time spent: over 30 min    Lacretia Nicks, MD [facility] Pager AMION  If 7PM-7AM, please contact night-coverage [email or web address] Password [email or web address] 03/19/2019, 2:00 PM

## 2019-03-20 ENCOUNTER — Inpatient Hospital Stay (HOSPITAL_COMMUNITY): Payer: Medicare Other

## 2019-03-20 ENCOUNTER — Encounter (HOSPITAL_COMMUNITY): Payer: Self-pay | Admitting: Internal Medicine

## 2019-03-20 LAB — FERRITIN: Ferritin: 793 ng/mL — ABNORMAL HIGH (ref 24–336)

## 2019-03-20 LAB — CULTURE, BLOOD (ROUTINE X 2)
Culture: NO GROWTH
Culture: NO GROWTH
Special Requests: ADEQUATE

## 2019-03-20 LAB — COMPREHENSIVE METABOLIC PANEL
ALT: 27 U/L (ref 0–44)
AST: 79 U/L — ABNORMAL HIGH (ref 15–41)
Albumin: 2.9 g/dL — ABNORMAL LOW (ref 3.5–5.0)
Alkaline Phosphatase: 290 U/L — ABNORMAL HIGH (ref 38–126)
Anion gap: 13 (ref 5–15)
BUN: 42 mg/dL — ABNORMAL HIGH (ref 8–23)
CO2: 26 mmol/L (ref 22–32)
Calcium: 8.5 mg/dL — ABNORMAL LOW (ref 8.9–10.3)
Chloride: 99 mmol/L (ref 98–111)
Creatinine, Ser: 0.85 mg/dL (ref 0.61–1.24)
GFR calc Af Amer: >60 mL/min (ref >60)
GFR calc non Af Amer: >60 mL/min (ref >60)
Glucose, Bld: 190 mg/dL — ABNORMAL HIGH (ref 70–99)
Potassium: 4.3 mmol/L (ref 3.5–5.1)
Sodium: 138 mmol/L (ref 135–145)
Total Bilirubin: 1.7 mg/dL — ABNORMAL HIGH (ref 0.3–1.2)
Total Protein: 6.1 g/dL — ABNORMAL LOW (ref 6.5–8.1)

## 2019-03-20 LAB — POCT I-STAT 7, (LYTES, BLD GAS, ICA,H+H)
Acid-Base Excess: 1 mmol/L (ref 0.0–2.0)
Bicarbonate: 31.2 mmol/L — ABNORMAL HIGH (ref 20.0–28.0)
Calcium, Ion: 1.24 mmol/L (ref 1.15–1.40)
HCT: 50 % (ref 39.0–52.0)
Hemoglobin: 17 g/dL (ref 13.0–17.0)
O2 Saturation: 94 %
Patient temperature: 98.6
Potassium: 3.9 mmol/L (ref 3.5–5.1)
Sodium: 143 mmol/L (ref 135–145)
TCO2: 33 mmol/L — ABNORMAL HIGH (ref 22–32)
pCO2 arterial: 72.8 mmHg (ref 32.0–48.0)
pH, Arterial: 7.24 — ABNORMAL LOW (ref 7.350–7.450)
pO2, Arterial: 88 mmHg (ref 83.0–108.0)

## 2019-03-20 LAB — CBC WITH DIFFERENTIAL/PLATELET
Abs Immature Granulocytes: 0.16 10*3/uL — ABNORMAL HIGH (ref 0.00–0.07)
Basophils Absolute: 0 10*3/uL (ref 0.0–0.1)
Basophils Relative: 0 %
Eosinophils Absolute: 0.1 10*3/uL (ref 0.0–0.5)
Eosinophils Relative: 1 %
HCT: 48.2 % (ref 39.0–52.0)
Hemoglobin: 16.2 g/dL (ref 13.0–17.0)
Immature Granulocytes: 2 %
Lymphocytes Relative: 6 %
Lymphs Abs: 0.6 10*3/uL — ABNORMAL LOW (ref 0.7–4.0)
MCH: 30.5 pg (ref 26.0–34.0)
MCHC: 33.6 g/dL (ref 30.0–36.0)
MCV: 90.6 fL (ref 80.0–100.0)
Monocytes Absolute: 0.1 10*3/uL (ref 0.1–1.0)
Monocytes Relative: 1 %
Neutro Abs: 8.6 10*3/uL — ABNORMAL HIGH (ref 1.7–7.7)
Neutrophils Relative %: 90 %
Platelets: 156 10*3/uL (ref 150–400)
RBC: 5.32 MIL/uL (ref 4.22–5.81)
RDW: 14.7 % (ref 11.5–15.5)
WBC: 9.5 10*3/uL (ref 4.0–10.5)
nRBC: 0.6 % — ABNORMAL HIGH (ref 0.0–0.2)

## 2019-03-20 LAB — C-REACTIVE PROTEIN: CRP: 2.2 mg/dL — ABNORMAL HIGH (ref <1.0)

## 2019-03-20 LAB — GLUCOSE, CAPILLARY
Glucose-Capillary: 171 mg/dL — ABNORMAL HIGH (ref 70–99)
Glucose-Capillary: 201 mg/dL — ABNORMAL HIGH (ref 70–99)
Glucose-Capillary: 195 mg/dL — ABNORMAL HIGH (ref 70–99)
Glucose-Capillary: 134 mg/dL — ABNORMAL HIGH (ref 70–99)

## 2019-03-20 LAB — PHOSPHORUS: Phosphorus: 3.8 mg/dL (ref 2.5–4.6)

## 2019-03-20 LAB — TRIGLYCERIDES: Triglycerides: 197 mg/dL — ABNORMAL HIGH (ref <150)

## 2019-03-20 LAB — D-DIMER, QUANTITATIVE: D-Dimer, Quant: 12.43 ug/mL-FEU — ABNORMAL HIGH (ref 0.00–0.50)

## 2019-03-20 LAB — MAGNESIUM: Magnesium: 1.8 mg/dL (ref 1.7–2.4)

## 2019-03-20 MED ORDER — LORAZEPAM 2 MG/ML IJ SOLN
0.5000 mg | Freq: Four times a day (QID) | INTRAMUSCULAR | Status: DC | PRN
  Administered 2019-03-20: 0.5 mg via INTRAVENOUS
  Filled 2019-03-20: qty 1

## 2019-03-20 MED ORDER — PHENOL 1.4 % MT LIQD
1.0000 | OROMUCOSAL | Status: DC | PRN
  Administered 2019-03-20: 1 via OROMUCOSAL
  Filled 2019-03-20: qty 177

## 2019-03-20 MED ORDER — INSULIN ASPART 100 UNIT/ML ~~LOC~~ SOLN: 4.0000 [IU] | Freq: Three times a day (TID) | SUBCUTANEOUS | Status: DC

## 2019-03-20 MED ORDER — LORAZEPAM 2 MG/ML IJ SOLN
0.5000 mg | Freq: Once | INTRAMUSCULAR | Status: AC
  Administered 2019-03-20: 0.5 mg via INTRAVENOUS
  Filled 2019-03-20: qty 1

## 2019-03-20 MED ORDER — ROCURONIUM BROMIDE 10 MG/ML (PF) SYRINGE
100.0000 mg | PREFILLED_SYRINGE | Freq: Once | INTRAVENOUS | Status: AC
  Administered 2019-03-20: 100 mg via INTRAVENOUS

## 2019-03-20 MED ORDER — FENTANYL 2500MCG IN NS 250ML (10MCG/ML) PREMIX INFUSION
0.0000 ug/h | INTRAVENOUS | Status: DC
  Administered 2019-03-20: 25 ug/h via INTRAVENOUS
  Administered 2019-03-21: 125 ug/h via INTRAVENOUS
  Administered 2019-03-22: 200 ug/h via INTRAVENOUS
  Filled 2019-03-20 (×2): qty 250

## 2019-03-20 MED ORDER — ROCURONIUM BROMIDE 10 MG/ML (PF) SYRINGE
PREFILLED_SYRINGE | INTRAVENOUS | Status: AC
  Filled 2019-03-20: qty 20

## 2019-03-20 MED ORDER — FENTANYL NICU BOLUS VIA INFUSION
25.0000 ug | INTRAVENOUS | Status: DC | PRN
  Administered 2019-03-20: 100 ug via INTRAVENOUS

## 2019-03-20 MED ORDER — PROPOFOL 10 MG/ML IV BOLUS
20.0000 mg | Freq: Once | INTRAVENOUS | Status: AC
  Administered 2019-03-20: 20 mg via INTRAVENOUS

## 2019-03-20 MED ORDER — ETOMIDATE 2 MG/ML IV SOLN
INTRAVENOUS | Status: AC
  Filled 2019-03-20: qty 20

## 2019-03-20 MED ORDER — PROPOFOL 1000 MG/100ML IV EMUL
INTRAVENOUS | Status: AC
  Filled 2019-03-20: qty 100

## 2019-03-20 MED ORDER — MAGNESIUM SULFATE 2 GM/50ML IV SOLN
2.0000 g | Freq: Once | INTRAVENOUS | Status: AC
  Administered 2019-03-20: 2 g via INTRAVENOUS
  Filled 2019-03-20: qty 50

## 2019-03-20 MED ORDER — ALPRAZOLAM 0.25 MG PO TABS: 0.2500 mg | ORAL_TABLET | Freq: Three times a day (TID) | ORAL | Status: DC | PRN

## 2019-03-20 MED ORDER — ALPRAZOLAM 0.25 MG PO TABS
0.2500 mg | ORAL_TABLET | Freq: Once | ORAL | Status: AC | PRN
  Administered 2019-03-20: 0.25 mg via ORAL
  Filled 2019-03-20: qty 1

## 2019-03-20 MED ORDER — MIDAZOLAM HCL 2 MG/2ML IJ SOLN
INTRAMUSCULAR | Status: AC
  Administered 2019-03-20: 2 mg
  Filled 2019-03-20: qty 4

## 2019-03-20 MED ORDER — POTASSIUM CHLORIDE CRYS ER 20 MEQ PO TBCR
40.0000 meq | EXTENDED_RELEASE_TABLET | Freq: Once | ORAL | Status: AC
  Administered 2019-03-20: 40 meq via ORAL
  Filled 2019-03-20: qty 2

## 2019-03-20 MED ORDER — LORAZEPAM 2 MG/ML IJ SOLN: 0.5000 mg | Freq: Three times a day (TID) | INTRAMUSCULAR | Status: DC | PRN

## 2019-03-20 MED ORDER — ROCURONIUM BROMIDE 10 MG/ML (PF) SYRINGE
PREFILLED_SYRINGE | INTRAVENOUS | Status: AC
  Filled 2019-03-20: qty 10

## 2019-03-20 MED ORDER — SUCCINYLCHOLINE CHLORIDE 200 MG/10ML IV SOSY
PREFILLED_SYRINGE | INTRAVENOUS | Status: AC
  Filled 2019-03-20: qty 10

## 2019-03-20 MED ORDER — PROPOFOL 10 MG/ML IV BOLUS
INTRAVENOUS | Status: AC
  Filled 2019-03-20: qty 20

## 2019-03-20 MED ORDER — PANTOPRAZOLE SODIUM 40 MG PO TBEC
40.0000 mg | DELAYED_RELEASE_TABLET | Freq: Every day | ORAL | Status: DC
  Administered 2019-03-20: 40 mg via ORAL
  Filled 2019-03-20 (×2): qty 1

## 2019-03-20 MED ORDER — FENTANYL CITRATE (PF) 100 MCG/2ML IJ SOLN
25.0000 ug | INTRAMUSCULAR | Status: DC | PRN
  Administered 2019-03-20: 100 ug via INTRAVENOUS

## 2019-03-20 MED ORDER — STERILE WATER FOR INJECTION IJ SOLN
INTRAMUSCULAR | Status: AC
  Filled 2019-03-20: qty 10

## 2019-03-20 MED ORDER — FENTANYL 2500MCG IN NS 250ML (10MCG/ML) PREMIX INFUSION
INTRAVENOUS | Status: AC
  Filled 2019-03-20: qty 250

## 2019-03-20 MED ORDER — PROPOFOL 1000 MG/100ML IV EMUL
5.0000 ug/kg/min | INTRAVENOUS | Status: DC
  Administered 2019-03-20: 22:00:00 20 ug/kg/min via INTRAVENOUS
  Administered 2019-03-21 (×4): 80 ug/kg/min via INTRAVENOUS
  Administered 2019-03-21: 03:00:00 30 ug/kg/min via INTRAVENOUS
  Administered 2019-03-21: 06:00:00 60 ug/kg/min via INTRAVENOUS
  Administered 2019-03-21 (×2): 80 ug/kg/min via INTRAVENOUS
  Administered 2019-03-21: 09:00:00 60 ug/kg/min via INTRAVENOUS
  Administered 2019-03-21 (×2): 80 ug/kg/min via INTRAVENOUS
  Administered 2019-03-22: 40 ug/kg/min via INTRAVENOUS
  Administered 2019-03-22: 01:00:00 60 ug/kg/min via INTRAVENOUS
  Administered 2019-03-22: 40 ug/kg/min via INTRAVENOUS
  Filled 2019-03-20 (×4): qty 100
  Filled 2019-03-20: qty 200
  Filled 2019-03-20 (×6): qty 100

## 2019-03-20 MED ORDER — FUROSEMIDE 10 MG/ML IJ SOLN
40.0000 mg | Freq: Four times a day (QID) | INTRAMUSCULAR | Status: AC
  Administered 2019-03-20 (×2): 40 mg via INTRAVENOUS
  Filled 2019-03-20 (×2): qty 4

## 2019-03-20 MED ORDER — INSULIN ASPART 100 UNIT/ML ~~LOC~~ SOLN
3.0000 [IU] | Freq: Three times a day (TID) | SUBCUTANEOUS | Status: DC
  Administered 2019-03-20: 18:00:00 3 [IU] via SUBCUTANEOUS

## 2019-03-20 MED ORDER — VECURONIUM BROMIDE 10 MG IV SOLR
INTRAVENOUS | Status: AC
  Filled 2019-03-20: qty 10

## 2019-03-20 MED ORDER — ETOMIDATE 2 MG/ML IV SOLN
20.0000 mg | Freq: Once | INTRAVENOUS | Status: AC
  Administered 2019-03-20: 20 mg via INTRAVENOUS

## 2019-03-20 MED ORDER — MIDAZOLAM HCL 2 MG/2ML IJ SOLN
2.0000 mg | Freq: Once | INTRAMUSCULAR | Status: AC
  Administered 2019-03-20: 2 mg via INTRAVENOUS

## 2019-03-20 MED ORDER — POLYETHYLENE GLYCOL 3350 17 G PO PACK
17.0000 g | PACK | Freq: Two times a day (BID) | ORAL | Status: DC
  Administered 2019-03-21 (×2): 17 g via ORAL
  Filled 2019-03-20 (×2): qty 1

## 2019-03-20 MED ORDER — FENTANYL CITRATE (PF) 100 MCG/2ML IJ SOLN
INTRAMUSCULAR | Status: AC
  Filled 2019-03-20: qty 2

## 2019-03-20 NOTE — Procedures (Signed)
Arterial Catheter Insertion Procedure Note David Robinson [id number] Aug 02, 1945  Procedure: Insertion of Arterial Catheter  Indications: Blood pressure monitoring and Frequent blood sampling  Procedure Details Consent: Risks of procedure as well as the alternatives and risks of each were explained to the (patient/caregiver).  Consent for procedure obtained. and Unable to obtain consent because of emergent medical necessity. Time Out: Verified patient identification, verified procedure, site/side was marked, verified correct patient position, special equipment/implants available, medications/allergies/relevent history reviewed, required imaging and test results available.  Performed  Maximum sterile technique was used including antiseptics, cap, gloves, gown, hand hygiene and mask. Skin prep: Chlorhexidine; local anesthetic administered 22 gauge catheter was inserted into left radial artery using the Seldinger technique. ULTRASOUND GUIDANCE USED: YES Evaluation Blood flow good; BP tracing good. Complications: No apparent complications.   David Robinson 03/20/2019

## 2019-03-20 NOTE — Progress Notes (Addendum)
PROGRESS NOTE    [facility]  OVF:[id number] DOB: October 17, 1945 DOA: 03/24/2019 PCP: Lorene Dy, MD   Brief Narrative:  73 year old male with type 2 diabetes mellitus, obese, came to the hospital with shortness of breath and was admitted on 03/24/2019.  He has been feeling sick for the past week and developed shortness of breath couple of days before coming to the emergency room.  He was so weak that he was barely able to walk.  He was admitted to the ICU due to increased oxygen requirements, and hospital course complicated by intermittent ICU delirium requiring Precedex.  Assessment & Plan:   Principal Problem:   Acute respiratory failure with hypoxia (HCC) Active Problems:   Acute respiratory distress syndrome (ARDS) due to COVID-19 virus (HCC)   Diabetes mellitus type 2 in obese [facility])   Essential hypertension   Pneumonia due to COVID-19 virus   Diabetes mellitus type 2, controlled, with complications ([place])   Obese  Acute Hypoxic Respiratory Failure  COVID 19 Pneumonia:  Continues to require significant O2 requirement - on 40 L [place] with NRB Continue remdesivir, steroids S/p plasma, actemra I/O, daily weights Goal O2 at rest >8%% and >75% with movement I/O, daily weights - continue lasix as tolerated OOB, IS Was on precedex for anxiety, but this did not appear to help.  Will d/c precedex, start xanax prn and follow closely.  COVID-19 Labs  Recent Labs    03/18/19 0645 03/19/19 0250 03/20/19 0429  DDIMER 15.83* 12.59* 12.43*  FERRITIN 638* 753* 793*  CRP 5.9* 3.5* 2.2*    Lab Results  Component Value Date   SARSCOV2NAA POSITIVE (Shelbi Vaccaro) 03/14/2019   Acute bilateral DVT, right peroneal veins and left gastrocnemius veins -Ultrasound also consistent with age-indeterminate superficial vein thrombosis of the right great saphenous vein -He is on full dose Lovenox, continue, he seems to be tolerating well, hemoglobin is stable and there is no evidence of bleeding -obtain  CTA when able - echo with hyperdynamic RV systolic function, normal EF  Acute metabolic encephalopathy / ICU delirium -Likely in the setting of ICU stay, patient has now history of EtOH abuse -delirium precautions -precedex did not seem to help with anxiety, will trial prn xanax, low dose   Type 2 diabetes mellitus -Continue basal/bolus and SSI, adjust as needed -CBGs fairly acceptable this morning, follow  Essential hypertension -Blood pressure stable  Acute kidney injury -Creatinine 1.68 on admission, this is now resolved, avoid nephrotoxic medications, monitor renal function while getting Lasix  Obesity -BMI 37, patient would benefit from weight loss  Elevated LFT's: bili 1.7 today, AST 79, alk phos 290.  Slightly rising.  Follow closely.  Consider further w/u as needed  DVT prophylaxis: lovenox Code Status: full  Family Communication: none at bedside - discussed with wife 12/18, 12/19  Disposition Plan: pending further improvement  Consultants:   none  Procedures:  LE [place] Summary: Right: Findings consistent with acute deep vein thrombosis involving the right peroneal veins. Findings consistent with age indeterminate superficial vein thrombosis involving the right great saphenous vein. No cystic structure found in the popliteal  fossa. Left: Findings consistent with acute deep vein thrombosis involving the left gastrocnemius veins. No cystic structure found in the popliteal fossa.  Echo IMPRESSIONS    1. Left ventricular ejection fraction, by visual estimation, is 60 to 65%. The left ventricle has normal function. There is mildly increased left ventricular hypertrophy.  2. Indeterminate diastolic filling due to E-Liat Mayol fusion.  3. The left ventricle  has no regional wall motion abnormalities.  4. Global right ventricle has hyperdynamic systolic function.The right ventricular size is normal. No increase in right ventricular wall thickness.  5. Left atrial size was  mildly dilated.  6. Right atrial size was normal.  7. Mild mitral annular calcification.  8. The mitral valve is normal in structure. No evidence of mitral valve regurgitation.  9. The tricuspid valve is normal in structure. Tricuspid valve regurgitation is not demonstrated. 10. The aortic valve is normal in structure. Aortic valve regurgitation is not visualized. 11. The pulmonic valve was not well visualized. Pulmonic valve regurgitation is not visualized. 12. TR signal is inadequate for assessing pulmonary artery systolic pressure. 13. The inferior vena cava is normal in size with greater than 50% respiratory variability, suggesting right atrial pressure of 3 mmHg.  Antimicrobials:  Anti-infectives (From admission, onward)   Start     Dose/Rate Route Frequency Ordered Stop   03/16/19 1500  cefTRIAXone (ROCEPHIN) 2 g in sodium chloride 0.9 % 100 mL IVPB  Status:  Discontinued     2 g 200 mL/hr over 30 Minutes Intravenous Every 24 hours 03/26/2019 1723 03/17/2019 1900   03/16/19 1100  azithromycin (ZITHROMAX) 500 mg in sodium chloride 0.9 % 250 mL IVPB  Status:  Discontinued     500 mg 250 mL/hr over 60 Minutes Intravenous Every 24 hours 03/05/2019 1723 03/14/2019 1900   03/16/19 1000  remdesivir 100 mg in sodium chloride 0.9 % 100 mL IVPB     100 mg 200 mL/hr over 30 Minutes Intravenous Daily 03/07/2019 1325 03/19/19 0929   03/07/2019 1745  cefTRIAXone (ROCEPHIN) 1 g in sodium chloride 0.9 % 100 mL IVPB  Status:  Discontinued     1 g 200 mL/hr over 30 Minutes Intravenous  Once 03/06/2019 1744 03/05/2019 1905   03/23/2019 1330  remdesivir 200 mg in sodium chloride 0.9% 250 mL IVPB     200 mg 580 mL/hr over 30 Minutes Intravenous Once 03/17/2019 1325 03/04/2019 1603   03/16/2019 1015  cefTRIAXone (ROCEPHIN) 1 g in sodium chloride 0.9 % 100 mL IVPB     1 g 200 mL/hr over 30 Minutes Intravenous  Once 03/21/2019 1013 03/03/2019 1337   03/08/2019 1015  azithromycin (ZITHROMAX) 500 mg in sodium chloride 0.9 % 250 mL  IVPB     500 mg 250 mL/hr over 60 Minutes Intravenous  Once 03/02/2019 1013 03/24/2019 1248     Subjective: C/o continued anxiety and SOB  Objective: Vitals:   03/20/19 0900 03/20/19 1000 03/20/19 1100 03/20/19 1200  BP: 130/85 138/78 (!) 150/68 137/74  Pulse: 83 82 97 90  Resp: (!) 23 (!) 31 (!) 29 (!) 30  Temp:    97.7 F (36.5 C)  TempSrc:    Axillary  SpO2: 90% (!) 88% 97% (!) 85%  Weight:      Height:        Intake/Output Summary (Last 24 hours) at 03/20/2019 1306 Last data filed at 03/20/2019 0600 Gross per 24 hour  Intake 612.35 ml  Output 1150 ml  Net -537.65 ml   Filed Weights   03/28/2019 0913 03/18/19 0500 03/19/19 0500  Weight: 127 kg 126.4 kg 126.9 kg    Examination:  General: No acute distress, anxious. Cardiovascular: RRR Lungs: on HHFNC with NRB, anxious, but without significantly increased WOB.  Distant lung sounds, no clear adventitious lung sounds. Abdomen: Soft, nontender, nondistended  Neurological: Alert and oriented 3. Moves all extremities 4. Cranial nerves II through XII grossly  intact. Skin: Warm and dry. No rashes or lesions. Extremities: No clubbing or cyanosis. No edema.   Data Reviewed: I have personally reviewed following labs and imaging studies  CBC: Recent Labs  Lab 03/16/19 0652 03/16/19 2249 03/17/19 0630 03/18/19 0645 03/19/19 0250 03/20/19 0429  WBC 6.2  --  14.3* 9.9 7.7 9.5  NEUTROABS 5.2  --  12.2* 8.6* 6.7 8.6*  HGB 15.8 14.3 15.1 13.9 15.3 16.2  HCT 46.9 42.0 43.8 41.2 45.3 48.2  MCV 91.4  --  92.2 92.4 90.8 90.6  PLT 201  --  214 209 193 762   Basic Metabolic Panel: Recent Labs  Lab 03/16/19 0652 03/16/19 2249 03/17/19 0630 03/18/19 0645 03/19/19 0250 03/20/19 0429  NA 139 137 139 139 139 138  K 4.0 3.8 4.1 3.8 4.0 4.3  CL 102  --  102 100 101 99  CO2 23  --  25 26 27 26   GLUCOSE 234*  --  230* 224* 166* 190*  BUN 35*  --  43* 44* 42* 42*  CREATININE 0.92  --  0.85 0.89 0.85 0.85  CALCIUM 8.6*  --   8.6* 8.4* 8.5* 8.5*  MG  --   --  1.8 1.8 1.7 1.8  PHOS  --   --  2.0* 2.6 2.9 3.8   GFR: Estimated Creatinine Clearance: 104.9 mL/min (by C-G formula based on SCr of 0.85 mg/dL). Liver Function Tests: Recent Labs  Lab 03/16/19 0652 03/17/19 0630 03/18/19 0645 03/19/19 0250 03/20/19 0429  AST 68* 62* 66* 74* 79*  ALT 23 20 26 24 27   ALKPHOS 96 121 197* 259* 290*  BILITOT 0.9 0.6 0.8 1.0 1.7*  PROT 6.7 6.1* 6.0* 6.1* 6.1*  ALBUMIN 2.7* 2.6* 2.6* 2.8* 2.9*   No results for input(s): LIPASE, AMYLASE in the last 168 hours. No results for input(s): AMMONIA in the last 168 hours. Coagulation Profile: No results for input(s): INR, PROTIME in the last 168 hours. Cardiac Enzymes: No results for input(s): CKTOTAL, CKMB, CKMBINDEX, TROPONINI in the last 168 hours. BNP (last 3 results) No results for input(s): PROBNP in the last 8760 hours. HbA1C: No results for input(s): HGBA1C in the last 72 hours. CBG: Recent Labs  Lab 03/18/19 2106 03/19/19 0806 03/19/19 1225 03/20/19 0754 03/20/19 1219  GLUCAP 202* 147* 172* 171* 201*   Lipid Profile: No results for input(s): CHOL, HDL, LDLCALC, TRIG, CHOLHDL, LDLDIRECT in the last 72 hours. Thyroid Function Tests: No results for input(s): TSH, T4TOTAL, FREET4, T3FREE, THYROIDAB in the last 72 hours. Anemia Panel: Recent Labs    03/19/19 0250 03/20/19 0429  FERRITIN 753* 793*   Sepsis Labs: Recent Labs  Lab 03/31/2019 0923 03/13/2019 1513  PROCALCITON  --  0.18  LATICACIDVEN 2.6*  --     Recent Results (from the past 240 hour(s))  Blood culture (routine x 2)     Status: None (Preliminary result)   Collection Time: 03/17/2019  9:22 AM   Specimen: BLOOD  Result Value Ref Range Status   Specimen Description BLOOD LEFT ANTECUBITAL  Final   Special Requests   Final    BOTTLES DRAWN AEROBIC AND ANAEROBIC Blood Culture adequate volume   Culture   Final    NO GROWTH 4 DAYS Performed at [facility], [place] [address]  [place], [place] [address]    Report Status PENDING  Incomplete  Respiratory Panel by RT PCR (Flu Coraima Tibbs&B, Covid) - Nasopharyngeal Swab     Status: Abnormal   Collection  Time: 03/27/2019 10:52 AM   Specimen: Nasopharyngeal Swab  Result Value Ref Range Status   SARS Coronavirus 2 by RT PCR POSITIVE (Shamina Etheridge) NEGATIVE Final    Comment: RESULT CALLED TO, READ BACK BY AND VERIFIED WITH: Revonda Humphrey RN 12:40 03/04/2019 ([email or web address]) (NOTE) SARS-CoV-2 target nucleic acids are DETECTED. SARS-CoV-2 RNA is generally detectable in upper respiratory specimens  during the acute phase of infection. Positive results are indicative of the presence of the identified virus, but do not rule out bacterial infection or co-infection with other pathogens not detected by the test. Clinical correlation with patient history and other diagnostic information is necessary to determine patient infection status. The expected result is Negative. Fact Sheet for Patients:  PinkCheek.be Fact Sheet for Healthcare Providers: GravelBags.it This test is not yet approved or cleared by the [place] FDA and  has been authorized for detection and/or diagnosis of SARS-CoV-2 by FDA under an Emergency Use Authorization (EUA).  This EUA will remain in effect (meaning this test can be used)  for the duration of  the COVID-19 declaration under Section 564(b)(1) of the Act, 21 U.S.C. section 360bbb-3(b)(1), unless the authorization is terminated or revoked sooner.    Influenza Caniya Tagle by PCR NEGATIVE NEGATIVE Final   Influenza B by PCR NEGATIVE NEGATIVE Final    Comment: (NOTE) The Xpert Xpress SARS-CoV-2/FLU/RSV assay is intended as an aid in  the diagnosis of influenza from Nasopharyngeal swab specimens and  should not be used as Atzel Mccambridge sole basis for treatment. Nasal washings and  aspirates are unacceptable for Xpert Xpress SARS-CoV-2/FLU/RSV  testing. Fact Sheet for  Patients: PinkCheek.be Fact Sheet for Healthcare Providers: GravelBags.it This test is not yet approved or cleared by the [place] FDA and  has been authorized for detection and/or diagnosis of SARS-CoV-2 by  FDA under an Emergency Use Authorization (EUA). This EUA will remain  in effect (meaning this test can be used) for the duration of the  Covid-19 declaration under Section 564(b)(1) of the Act, 21  U.S.C. section 360bbb-3(b)(1), unless the authorization is  terminated or revoked. Performed at [facility], [place] [address] [place], [place] [address]   Culture, blood (Routine X 2) w Reflex to ID Panel     Status: None (Preliminary result)   Collection Time: 03/10/2019  3:13 PM   Specimen: BLOOD RIGHT HAND  Result Value Ref Range Status   Specimen Description BLOOD RIGHT HAND  Final   Special Requests   Final    BOTTLES DRAWN AEROBIC ONLY Blood Culture results may not be optimal due to an inadequate volume of blood received in culture bottles   Culture   Final    NO GROWTH 4 DAYS Performed at [facility], [place] [address], [place], [place] [address]    Report Status PENDING  Incomplete  MRSA PCR Screening     Status: None   Collection Time: 03/16/19  3:00 AM   Specimen: Nasopharyngeal  Result Value Ref Range Status   MRSA by PCR NEGATIVE NEGATIVE Final    Comment:        The GeneXpert MRSA Assay (FDA approved for NASAL specimens only), is one component of Leyah Bocchino comprehensive MRSA colonization surveillance program. It is not intended to diagnose MRSA infection nor to guide or monitor treatment for MRSA infections. Performed at [facility], [place] [address], [place],  [address]          Radiology Studies: No results found.  Scheduled Meds: . acetaminophen  650 mg Oral Once  . vitamin C  500 mg Oral Daily  . busPIRone  5 mg Oral BID  . chlorhexidine  15 mL Mouth  Rinse BID  . Chlorhexidine Gluconate Cloth  6 each Topical Daily  . enoxaparin (LOVENOX) injection  1 mg/kg Subcutaneous Q12H  . furosemide  40 mg Intravenous Q6H  . insulin aspart  0-15 Units Subcutaneous TID WC  . insulin aspart  0-5 Units Subcutaneous QHS  . insulin aspart  2 Units Subcutaneous TID WC  . insulin glargine  20 Units Subcutaneous QHS  . Ipratropium-Albuterol  1 puff Inhalation Q6H  . mouth rinse  15 mL Mouth Rinse q12n4p  . methylPREDNISolone (SOLU-MEDROL) injection  60 mg Intravenous Q12H  . sodium chloride flush  3 mL Intravenous Q12H  . sodium chloride flush  3 mL Intravenous Q12H  . zinc sulfate  220 mg Oral Daily   Continuous Infusions: . sodium chloride    . dexmedetomidine (PRECEDEX) IV infusion 0.2 mcg/kg/hr (03/20/19 0930)     LOS: 5 days    Time spent: over 30 min 45 min critical care time with AHRF 2/2 COVID pneumonia    Fayrene Helper, MD [facility] Pager AMION  If 7PM-7AM, please contact night-coverage [email or web address] Password [email or web address] 03/20/2019, 1:06 PM

## 2019-03-20 NOTE — Progress Notes (Signed)
Ativan 0.5 mg one time dose ordered.

## 2019-03-20 NOTE — Progress Notes (Signed)
Spoke with patients wife over the phone. She was updated on the patients condition and current therapies. I answered all questions she had concerning the patient

## 2019-03-20 NOTE — Plan of Care (Signed)
POC discussed with patient. Remains A&O. Denies pain. Pleasant and cooperative. On 40 L 100% Fi02 w/ NRB. Tolerating well, desats when coughing but come up quickly cough managed with PRN meds. Afebrile, tolerating PO meds and snacks. No Sliding scale coverage needed, just Lantus scheduled. Condom cath in place, will continue  to watch output.  Heloed turn left/right q2. No other issues noted at this time.

## 2019-03-20 NOTE — Progress Notes (Signed)
   NAME:  David Robinson, MRN:  [id number], DOB:  [birth year], LOS: 5 ADMISSION DATE:  March 22, 2019, CONSULTATION DATE:  03/29/2019 REFERRING MD:  EDP - [place], CHIEF COMPLAINT:  COVID-19 respiratory failure   Brief History   61 to M with SOB  History of present illness    73 yo M PMH DM HTN who presents to ED with CC productive cough, sob, chest pain with inspiration, fatigue. These symptoms began 5 days prior to presentation and have increased in severity, notably the patient felt too weak to stand. EMS was dispatched for this weakness and SOB, finding the patient to have SpO2 50% upon their arrival. He was transported to ED. In ED patient is hypoxic required 15L NRB + high flow. Patient tested positive for COVID-19 in ED.   ABG: 7.35/36.3/52/23/2/22.3 Na 136 K 4.1 Cl 98 Co2 22 Glu 185 BUN 38, Cr 1.68 BNP 113.6, Trop 34   PCCM asked to see in consultation.   Past Medical History  DM HTN  Significant Hospital Events   12/14> admitted   Consults:  PCCM  Procedures:    Significant Diagnostic Tests:  CXR12/14> Diffuse interstitial and airspace opacities   Micro Data:  12/14 SARS Cov-2> positive Pct neg  Antimicrobials:  Remdesivir 12/14>>12/18 Actemra x 2, 12/14, 12/15   Interim history/subjective:  Remains tenuous on heated high flow. Anxious States he cannot catch breath with even minimal activity.  Objective   Blood pressure 137/74, pulse 90, temperature 97.7 F (36.5 C), temperature source Axillary, resp. rate (!) 30, height 6' (1.829 m), weight 126.9 kg, SpO2 (!) 85 %.    FiO2 (%):  [100 %] 100 %   Intake/Output Summary (Last 24 hours) at 03/20/2019 1328 Last data filed at 03/20/2019 0600 Gross per 24 hour  Intake 612.35 ml  Output 1150 ml  Net -537.65 ml   Filed Weights   March 22, 2019 0913 03/18/19 0500 03/19/19 0500  Weight: 127 kg 126.4 kg 126.9 kg    Examination: GEN: obese elderly man lying in bed HEENT: MMM, trachea midline CV:  RRR, ext warm PULM: shallow inspiratory efforts,  GI: mildly distended, hypoactive BS EXT: minimal edema NEURO: moves all 4 ext to command PSYCH: Anxious but redirectable SKIN: No rashes  BMP stable, mg a bit low Plts slightly dropped, WBC/Hgb stable D dimer stably elevated No new chest imaging  Resolved Hospital Problem list   N/A  Assessment & Plan:  # Severe COVID ARDS- cannot prone due to size of stomach and anxiety.  Has received actemra x 2, remdesivir, and ongoing steroids.  No evidence of superimposed infection.  Possible volume overloaded state of heart could be contributing. - Remains tenuous with high risk for intubation - Encourage IS - Agree with continued diuretic challenge  # Constipation- does not help FRC, change miralax to standing  # DM2- A1c 6.6% on admission, worsened by steroids - Per primary  # Anxiety - No effect of precedex, cautious trial of benzodiazepines  Keep in ICU Will follow with you

## 2019-03-20 NOTE — Progress Notes (Signed)
Pt very anxious at beginning of shift and O2 sats maintaining low 70's. PRN meds given without relief. Pt consented to intubation. MD Francie Massing here from [facility] and intubated pt  2mg  Versed given 2112 20 Etomidate 2113 100 Roc 2114 Intubation at 2115  Bilateral breath sounds. OG put in. Xray obtained. Sedation orders placed. Family called by MD Francie Massing and [place].

## 2019-03-21 ENCOUNTER — Inpatient Hospital Stay (HOSPITAL_COMMUNITY): Payer: Medicare Other

## 2019-03-21 LAB — POCT I-STAT 7, (LYTES, BLD GAS, ICA,H+H)
Acid-base deficit: 2 mmol/L (ref 0.0–2.0)
Acid-base deficit: 8 mmol/L — ABNORMAL HIGH (ref 0.0–2.0)
Bicarbonate: 26.7 mmol/L (ref 20.0–28.0)
Bicarbonate: 22.2 mmol/L (ref 20.0–28.0)
Calcium, Ion: 1.11 mmol/L — ABNORMAL LOW (ref 1.15–1.40)
Calcium, Ion: 1.07 mmol/L — ABNORMAL LOW (ref 1.15–1.40)
HCT: 49 % (ref 39.0–52.0)
HCT: 47 % (ref 39.0–52.0)
Hemoglobin: 16.7 g/dL (ref 13.0–17.0)
Hemoglobin: 16 g/dL (ref 13.0–17.0)
O2 Saturation: 95 %
O2 Saturation: 92 %
Patient temperature: 98.6
Potassium: 4.9 mmol/L (ref 3.5–5.1)
Potassium: 4.8 mmol/L (ref 3.5–5.1)
Sodium: 138 mmol/L (ref 135–145)
Sodium: 139 mmol/L (ref 135–145)
TCO2: 28 mmol/L (ref 22–32)
TCO2: 24 mmol/L (ref 22–32)
pCO2 arterial: 57.7 mmHg — ABNORMAL HIGH (ref 32.0–48.0)
pCO2 arterial: 66.9 mmHg (ref 32.0–48.0)
pH, Arterial: 7.274 — ABNORMAL LOW (ref 7.350–7.450)
pH, Arterial: 7.128 — CL (ref 7.350–7.450)
pO2, Arterial: 89 mmHg (ref 83.0–108.0)
pO2, Arterial: 86 mmHg (ref 83.0–108.0)

## 2019-03-21 LAB — CBC WITH DIFFERENTIAL/PLATELET
Abs Immature Granulocytes: 1.58 10*3/uL — ABNORMAL HIGH (ref 0.00–0.07)
Basophils Absolute: 0.2 10*3/uL — ABNORMAL HIGH (ref 0.0–0.1)
Basophils Relative: 1 %
Eosinophils Absolute: 0.1 10*3/uL (ref 0.0–0.5)
Eosinophils Relative: 0 %
HCT: 50.1 % (ref 39.0–52.0)
Hemoglobin: 16.1 g/dL (ref 13.0–17.0)
Immature Granulocytes: 6 %
Lymphocytes Relative: 2 %
Lymphs Abs: 0.7 10*3/uL (ref 0.7–4.0)
MCH: 30.6 pg (ref 26.0–34.0)
MCHC: 32.1 g/dL (ref 30.0–36.0)
MCV: 95.1 fL (ref 80.0–100.0)
Monocytes Absolute: 0.9 10*3/uL (ref 0.1–1.0)
Monocytes Relative: 3 %
Neutro Abs: 24.4 10*3/uL — ABNORMAL HIGH (ref 1.7–7.7)
Neutrophils Relative %: 88 %
Platelets: 242 10*3/uL (ref 150–400)
RBC: 5.27 MIL/uL (ref 4.22–5.81)
RDW: 16.5 % — ABNORMAL HIGH (ref 11.5–15.5)
WBC: 27.8 10*3/uL — ABNORMAL HIGH (ref 4.0–10.5)
nRBC: 0.7 % — ABNORMAL HIGH (ref 0.0–0.2)

## 2019-03-21 LAB — COMPREHENSIVE METABOLIC PANEL
ALT: 29 U/L (ref 0–44)
AST: 61 U/L — ABNORMAL HIGH (ref 15–41)
Albumin: 2.8 g/dL — ABNORMAL LOW (ref 3.5–5.0)
Alkaline Phosphatase: 263 U/L — ABNORMAL HIGH (ref 38–126)
Anion gap: 18 — ABNORMAL HIGH (ref 5–15)
BUN: 48 mg/dL — ABNORMAL HIGH (ref 8–23)
CO2: 24 mmol/L (ref 22–32)
Calcium: 8.5 mg/dL — ABNORMAL LOW (ref 8.9–10.3)
Chloride: 99 mmol/L (ref 98–111)
Creatinine, Ser: 1.84 mg/dL — ABNORMAL HIGH (ref 0.61–1.24)
GFR calc Af Amer: 41 mL/min — ABNORMAL LOW (ref >60)
GFR calc non Af Amer: 35 mL/min — ABNORMAL LOW (ref >60)
Glucose, Bld: 159 mg/dL — ABNORMAL HIGH (ref 70–99)
Potassium: 4.6 mmol/L (ref 3.5–5.1)
Sodium: 141 mmol/L (ref 135–145)
Total Bilirubin: 1.5 mg/dL — ABNORMAL HIGH (ref 0.3–1.2)
Total Protein: 6.1 g/dL — ABNORMAL LOW (ref 6.5–8.1)

## 2019-03-21 LAB — GLUCOSE, CAPILLARY
Glucose-Capillary: 185 mg/dL — ABNORMAL HIGH (ref 70–99)
Glucose-Capillary: 179 mg/dL — ABNORMAL HIGH (ref 70–99)
Glucose-Capillary: 189 mg/dL — ABNORMAL HIGH (ref 70–99)
Glucose-Capillary: 197 mg/dL — ABNORMAL HIGH (ref 70–99)
Glucose-Capillary: 199 mg/dL — ABNORMAL HIGH (ref 70–99)
Glucose-Capillary: 207 mg/dL — ABNORMAL HIGH (ref 70–99)

## 2019-03-21 LAB — C-REACTIVE PROTEIN: CRP: 2.2 mg/dL — ABNORMAL HIGH (ref <1.0)

## 2019-03-21 LAB — HEPARIN LEVEL (UNFRACTIONATED)
Heparin Unfractionated: 1.32 IU/mL — ABNORMAL HIGH (ref 0.30–0.70)
Heparin Unfractionated: >2.2 IU/mL — ABNORMAL HIGH (ref 0.30–0.70)

## 2019-03-21 LAB — PHOSPHORUS: Phosphorus: 6.5 mg/dL — ABNORMAL HIGH (ref 2.5–4.6)

## 2019-03-21 LAB — D-DIMER, QUANTITATIVE: D-Dimer, Quant: 9.64 ug/mL-FEU — ABNORMAL HIGH (ref 0.00–0.50)

## 2019-03-21 LAB — MAGNESIUM: Magnesium: 2.1 mg/dL (ref 1.7–2.4)

## 2019-03-21 LAB — APTT: aPTT: 41 seconds — ABNORMAL HIGH (ref 24–36)

## 2019-03-21 LAB — PROCALCITONIN: Procalcitonin: 0.91 ng/mL

## 2019-03-21 LAB — FERRITIN: Ferritin: 680 ng/mL — ABNORMAL HIGH (ref 24–336)

## 2019-03-21 MED ORDER — VITAL HIGH PROTEIN PO LIQD
1000.0000 mL | ORAL | Status: DC
  Administered 2019-03-21: 1000 mL

## 2019-03-21 MED ORDER — VANCOMYCIN VARIABLE DOSE PER UNSTABLE RENAL FUNCTION (PHARMACIST DOSING): Status: DC

## 2019-03-21 MED ORDER — NOREPINEPHRINE 4 MG/250ML-% IV SOLN
0.0000 ug/min | INTRAVENOUS | Status: DC
  Administered 2019-03-21: 18:00:00 40 ug/min via INTRAVENOUS
  Administered 2019-03-21: 34 ug/min via INTRAVENOUS
  Administered 2019-03-21: 38 ug/min via INTRAVENOUS
  Administered 2019-03-21: 14:00:00 2 ug/min via INTRAVENOUS
  Administered 2019-03-21: 40 ug/min via INTRAVENOUS
  Filled 2019-03-21: qty 500
  Filled 2019-03-21: qty 250
  Filled 2019-03-21: qty 500
  Filled 2019-03-21: qty 250

## 2019-03-21 MED ORDER — INSULIN ASPART 100 UNIT/ML ~~LOC~~ SOLN: 3.0000 [IU] | Freq: Four times a day (QID) | SUBCUTANEOUS | Status: DC

## 2019-03-21 MED ORDER — NOREPINEPHRINE 16 MG/250ML-% IV SOLN
0.0000 ug/min | INTRAVENOUS | Status: DC
  Administered 2019-03-22: 40 ug/min via INTRAVENOUS
  Administered 2019-03-22: 05:00:00 50 ug/min via INTRAVENOUS
  Filled 2019-03-21 (×2): qty 250

## 2019-03-21 MED ORDER — INSULIN GLARGINE 100 UNIT/ML ~~LOC~~ SOLN
15.0000 [IU] | Freq: Every day | SUBCUTANEOUS | Status: DC
  Administered 2019-03-21: 22:00:00 15 [IU] via SUBCUTANEOUS
  Filled 2019-03-21: qty 0.15

## 2019-03-21 MED ORDER — VASOPRESSIN 20 UNIT/ML IV SOLN
0.0500 [IU]/min | INTRAVENOUS | Status: DC
  Administered 2019-03-21: 0.04 [IU]/min via INTRAVENOUS
  Administered 2019-03-21: 0.03 [IU]/min via INTRAVENOUS
  Filled 2019-03-21 (×3): qty 2

## 2019-03-21 MED ORDER — SODIUM CHLORIDE 0.9 % IV SOLN: 250.0000 mL | INTRAVENOUS | Status: DC

## 2019-03-21 MED ORDER — LACTATED RINGERS IV BOLUS
1000.0000 mL | Freq: Once | INTRAVENOUS | Status: AC
  Administered 2019-03-21: 1000 mL via INTRAVENOUS

## 2019-03-21 MED ORDER — DEXAMETHASONE SODIUM PHOSPHATE 10 MG/ML IJ SOLN
6.0000 mg | INTRAMUSCULAR | Status: DC
  Administered 2019-03-21: 6 mg via INTRAVENOUS
  Filled 2019-03-21 (×2): qty 1

## 2019-03-21 MED ORDER — ARTIFICIAL TEARS OPHTHALMIC OINT
1.0000 "application " | TOPICAL_OINTMENT | Freq: Three times a day (TID) | OPHTHALMIC | Status: DC
  Administered 2019-03-21 – 2019-03-22 (×3): 1 via OPHTHALMIC
  Filled 2019-03-21 (×2): qty 3.5

## 2019-03-21 MED ORDER — VANCOMYCIN HCL 2000 MG/400ML IV SOLN
2000.0000 mg | Freq: Once | INTRAVENOUS | Status: AC
  Administered 2019-03-21: 20:00:00 2000 mg via INTRAVENOUS
  Filled 2019-03-21: qty 400

## 2019-03-21 MED ORDER — DEXAMETHASONE SODIUM PHOSPHATE 10 MG/ML IJ SOLN: 10.0000 mg | INTRAMUSCULAR | Status: DC

## 2019-03-21 MED ORDER — HYDROCORTISONE NA SUCCINATE PF 100 MG IJ SOLR
50.0000 mg | Freq: Four times a day (QID) | INTRAMUSCULAR | Status: DC
  Administered 2019-03-21 – 2019-03-22 (×2): 50 mg via INTRAVENOUS
  Filled 2019-03-21 (×2): qty 2

## 2019-03-21 MED ORDER — INSULIN ASPART 100 UNIT/ML ~~LOC~~ SOLN
3.0000 [IU] | SUBCUTANEOUS | Status: DC
  Administered 2019-03-21 – 2019-03-22 (×3): 3 [IU] via SUBCUTANEOUS

## 2019-03-21 MED ORDER — SODIUM CHLORIDE 0.9 % IV SOLN
250.0000 mL | INTRAVENOUS | Status: DC
  Administered 2019-03-21: 20:00:00 250 mL via INTRAVENOUS
  Administered 2019-03-21: 1000 mL via INTRAVENOUS
  Administered 2019-03-21: 250 mL via INTRAVENOUS

## 2019-03-21 MED ORDER — HEPARIN (PORCINE) 25000 UT/250ML-% IV SOLN
1700.0000 [IU]/h | INTRAVENOUS | Status: DC
  Administered 2019-03-21: 1700 [IU]/h via INTRAVENOUS
  Filled 2019-03-21: qty 250

## 2019-03-21 MED ORDER — PHENYLEPHRINE HCL-NACL 10-0.9 MG/250ML-% IV SOLN
25.0000 ug/min | INTRAVENOUS | Status: DC
  Administered 2019-03-21 (×6): 200 ug/min via INTRAVENOUS
  Administered 2019-03-21: 25 ug/min via INTRAVENOUS
  Administered 2019-03-21: 200 ug/min via INTRAVENOUS
  Administered 2019-03-21: 75 ug/min via INTRAVENOUS
  Administered 2019-03-21 (×2): 200 ug/min via INTRAVENOUS
  Filled 2019-03-21: qty 250
  Filled 2019-03-21: qty 2500
  Filled 2019-03-21 (×10): qty 250

## 2019-03-21 MED ORDER — INSULIN ASPART 100 UNIT/ML ~~LOC~~ SOLN
0.0000 [IU] | SUBCUTANEOUS | Status: DC
  Administered 2019-03-21: 5 [IU] via SUBCUTANEOUS
  Administered 2019-03-21 – 2019-03-22 (×2): 3 [IU] via SUBCUTANEOUS
  Administered 2019-03-22: 2 [IU] via SUBCUTANEOUS

## 2019-03-21 MED ORDER — FAMOTIDINE IN NACL 20-0.9 MG/50ML-% IV SOLN
20.0000 mg | INTRAVENOUS | Status: DC
  Administered 2019-03-21: 11:00:00 20 mg via INTRAVENOUS
  Filled 2019-03-21 (×2): qty 50

## 2019-03-21 MED ORDER — SODIUM CHLORIDE 0.9 % IV SOLN
2.0000 g | Freq: Two times a day (BID) | INTRAVENOUS | Status: DC
  Administered 2019-03-21: 2 g via INTRAVENOUS
  Filled 2019-03-21: qty 2

## 2019-03-21 MED ORDER — PHENYLEPHRINE CONCENTRATED 100MG/250ML (0.4 MG/ML) INFUSION SIMPLE
25.0000 ug/min | INTRAVENOUS | Status: DC
  Administered 2019-03-21 – 2019-03-22 (×2): 200 ug/min via INTRAVENOUS
  Filled 2019-03-21: qty 500
  Filled 2019-03-21: qty 250

## 2019-03-21 MED ORDER — CISATRACURIUM BESYLATE (PF) 10 MG/5ML IV SOLN
0.1000 mg/kg | INTRAVENOUS | Status: DC | PRN
  Administered 2019-03-21 – 2019-03-22 (×3): 12.6 mg via INTRAVENOUS
  Filled 2019-03-21 (×6): qty 6.3

## 2019-03-21 MED ORDER — SODIUM BICARBONATE 8.4 % IV SOLN
50.0000 meq | Freq: Once | INTRAVENOUS | Status: AC
  Administered 2019-03-21: 50 meq via INTRAVENOUS

## 2019-03-21 NOTE — Progress Notes (Signed)
Pt unporned at 1235 for central line placement

## 2019-03-21 NOTE — Progress Notes (Signed)
ANTICOAGULATION CONSULT NOTE  Pharmacy Consult for Heparin Indication: DVT  Not on File  Patient Measurements: Height: 6' (182.9 cm) Weight: 279 lb 12.2 oz (126.9 kg) IBW/kg (Calculated) : 77.6 Heparin dosing weight: 106 kg  Vital Signs: Temp: 97.9 F (36.6 C) (12/20 2000) Temp Source: Oral (12/20 2000) BP: 121/36 (12/20 2000) Pulse Rate: 112 (12/20 2000)  Labs: Recent Labs    03/19/19 0250 03/20/19 0429 03/21/19 0430 03/21/19 0620 03/21/19 1006 03/21/19 1510 03/21/19 1815  HGB 15.3 16.2 16.1 16.7  --  16.0  --   HCT 45.3 48.2 50.1 49.0  --  47.0  --   PLT 193 156 242  --   --   --   --   APTT  --   --   --   --  41*  --   --   HEPARINUNFRC  --   --   --   --  1.32*  --  >2.20*  CREATININE 0.85 0.85 1.84*  --   --   --   --     Estimated Creatinine Clearance: 48.5 mL/min (A) (by C-G formula based on SCr of 1.84 mg/dL (H)).  Medications:  Scheduled:  . acetaminophen  650 mg Oral Once  . artificial tears  1 application Both Eyes N4O  . vitamin C  500 mg Oral Daily  . busPIRone  5 mg Oral BID  . chlorhexidine  15 mL Mouth Rinse BID  . Chlorhexidine Gluconate Cloth  6 each Topical Daily  . feeding supplement (VITAL HIGH PROTEIN)  1,000 mL Per Tube Q24H  . hydrocortisone sod succinate (SOLU-CORTEF) inj  50 mg Intravenous Q6H  . insulin aspart  0-15 Units Subcutaneous Q4H  . insulin aspart  3 Units Subcutaneous Q4H  . insulin glargine  15 Units Subcutaneous QHS  . mouth rinse  15 mL Mouth Rinse q12n4p  . polyethylene glycol  17 g Oral BID  . sodium chloride flush  3 mL Intravenous Q12H  . sodium chloride flush  3 mL Intravenous Q12H  . vancomycin variable dose per unstable renal function (pharmacist dosing)   Does not apply See admin instructions  . zinc sulfate  220 mg Oral Daily   . sodium chloride    . sodium chloride 250 mL (03/21/19 2013)  . sodium chloride    . ceFEPime (MAXIPIME) IV    . famotidine (PEPCID) IV Stopped (03/21/19 1100)  . fentaNYL  infusion INTRAVENOUS 200 mcg/hr (03/21/19 2000)  . norepinephrine (LEVOPHED) Adult infusion 38 mcg/min (03/21/19 2000)  . phenylephrine (NEO-SYNEPHRINE) Adult infusion 200 mcg/min (03/21/19 2000)  . propofol (DIPRIVAN) infusion 80 mcg/kg/min (03/21/19 2000)  . vancomycin 2,000 mg (03/21/19 2001)  . vasopressin (PITRESSIN) infusion - *FOR SHOCK* 0.04 Units/min (03/21/19 2000)     Assessment: 73 y/o male with a PMH significant for HTN and DM, presenting to [facility] with SOB on 12/14, has now been transferred to [facility] ICU. Pharmacy has been consulted to start therapeutic Lovenox d/t concern for PE with Ddimer >20.  No CTa done d/t concern for AKI.  12/16 LE dopplers + bilateral DVT Today with worsening AKI (SCr 0.85 > 1.84) pharmacy is consulted to transition to Heparin IV. Last Lovenox dose on 12/19 at 18:49. CBC: stable, WNL D-dimer is now decreased to 9.6 LFT elevated, but improving.  AST/ALT 61/29, Tbili 1.5  PM: Heparin level > 2.2.  Heparin level was 1.32 prior to starting heparin drip which was likely reflective of residual lovenox accumulation with rising SCr.  No bleeding complications per discussion with RN.  Goal of Therapy:  Heparin level 0.3 - 0.7 Monitor platelets by anticoagulation protocol: Yes   Plan:  Hold heparin drip x 2 hours and recheck heparin level Once level < 0.7, will resume heparin drip Monitor for s/s bleeding.  Loralee Pacas, PharmD, BCPS Pharmacy: [phone] 03/21/2019 8:47 PM

## 2019-03-21 NOTE — Progress Notes (Signed)
NAME:  JOANNA HALL, MRN:  [id number], DOB:  03-May-1945, LOS: 6 ADMISSION DATE:  03/03/2019, CONSULTATION DATE:  03/13/2019 REFERRING MD:  EDP - [place], CHIEF COMPLAINT:  COVID-19 respiratory failure   Brief History   67 to M with SOB  History of present illness    73 yo M PMH DM HTN who presents to ED with CC productive cough, sob, chest pain with inspiration, fatigue. These symptoms began 5 days prior to presentation and have increased in severity, notably the patient felt too weak to stand. EMS was dispatched for this weakness and SOB, finding the patient to have SpO2 50% upon their arrival. He was transported to ED. In ED patient is hypoxic required 15L NRB + high flow. Patient tested positive for COVID-19 in ED.   ABG: 7.35/36.3/52/23/2/22.3 Na 136 K 4.1 Cl 98 Co2 22 Glu 185 BUN 38, Cr 1.68 BNP 113.6, Trop 34   PCCM asked to see in consultation.   Past Medical History  DM HTN  Significant Hospital Events   12/14> admitted   Consults:  PCCM  Procedures:    Significant Diagnostic Tests:  CXR12/14> Diffuse interstitial and airspace opacities   Micro Data:  12/14 SARS Cov-2> positive Pct neg  Antimicrobials:  Remdesivir 12/14>>12/18 Actemra x 2, 12/14, 12/15   Interim history/subjective:  Intubated last night for some combination worsening anxiety or delirium. Now proned, sedated, and  Objective   Blood pressure 136/71, pulse 79, temperature 99.8 F (37.7 C), temperature source Axillary, resp. rate 12, height 6' (1.829 m), weight 126.9 kg, SpO2 94 %.    Vent Mode: PRVC FiO2 (%):  [100 %] 100 % Set Rate:  [25 bmp] 25 bmp Vt Set:  [470 mL] 470 mL PEEP:  [15 cmH20] 15 cmH20 Plateau Pressure:  [29 cmH20-30 cmH20] 29 cmH20   Intake/Output Summary (Last 24 hours) at 03/21/2019 0829 Last data filed at 03/21/2019 5852 Gross per 24 hour  Intake 473.89 ml  Output 1930 ml  Net -1456.11 ml   Filed Weights   03/24/2019 0913 03/18/19 0500  03/19/19 0500  Weight: 127 kg 126.4 kg 126.9 kg    Examination: GEN: obese elderly man lying in bed proned on vent HEENT: ETT in place, minimal secretions CV: ext warm, cannot ausultate heart proned PULM: suprisingly clear GI: cannot assess EXT: minimal edema NEURO: triggering vent, withdraws to pain PSYCH: RASS -3 SKIN: No rashes  Acute kidney injury has developed with diuresis and proning Significant leukocytosis has developed as well D dimer improved  Resolved Hospital Problem list   N/A  Assessment & Plan:  # COVID19 ARDS- on vent - ARDSnet PEEP/FiO2, attempt to limit driving pressures as allowed by lung compliance - VAP prevention bundle -18/6h proning with A/a gradients < 150, adjust based on clinical response - Sedation to minimize shearing forces created by ventilator assynchrony - Keep even as able with balanced diuresis as tolerated by renal function - Finishing 5 days remdesivir and 10 days steroids 12/20: flip supine, place line, sedate more heavily and consider starting paralytic, he is unable to achieve 6-8cc/kg otherwise.  Actually has great compliance but available data (to date) shows we should not allow permissive higher TVs.  # Constipation- continue standing miralax, had BM yesterday  # DM2- A1c 6.6% on admission, worsened by steroids - Start trickle feeds, monitor FS, continue lantus/ssi  # Acute DVT- need to discuss Xa monitoring with lovenox vs. Start heparin gtt with pharmacist today  Best practice:  Diet: start trickle  feeds Pain/Anxiety/Delirium protocol (if indicated): propofol, fentanyl, PRN benzodiazepines VAP protocol (if indicated): ordered DVT prophylaxis: see above GI prophylaxis: switch to pepcid Glucose control: see above Mobility: BR Code Status: full Family Communication: will call today Disposition: ICU    The patient is critically ill with multiple organ systems failure and requires high complexity decision making for  assessment and support, frequent evaluation and titration of therapies, application of advanced monitoring technologies and extensive interpretation of multiple databases. Critical Care Time devoted to patient care services described in this note independent of APP/resident time (if applicable)  is 40 minutes.   Myrla Halsted MD [facility] 03/21/2019 8:55 AM Personal pager: [phone] If unanswered, please page CCM On-call: [phone]

## 2019-03-21 NOTE — Progress Notes (Signed)
Pt was placed on cpap/ps by Dr Tamala Julian. Patient is prone and has VT of 1022 rate 16.

## 2019-03-21 NOTE — Progress Notes (Signed)
RT assisted to turn pt's head to the left. ETT secured with 24 at the lips with cloth tape.

## 2019-03-21 NOTE — Progress Notes (Signed)
Pharmacy Antibiotic Note  David Robinson is a 74 y.o. male admitted on 03/03/2019 with respiratory failure due to COVID-19.  Pharmacy has been consulted for vancomycin and cefepime dosing 03/21/19 for empiric coverage of sepsis.  Plan:  Cefepime 2g IV q12h  Vancomycin 2g IV x 1, check random level in AM  Follow up renal function & cultures  Height: 6' (182.9 cm) Weight: 279 lb 12.2 oz (126.9 kg) IBW/kg (Calculated) : 77.6  Temp (24hrs), Avg:98.4 F (36.9 C), Min:97.9 F (36.6 C), Max:98.8 F (37.1 C)  Recent Labs  Lab 03/26/2019 0923 03/17/19 0630 03/18/19 0645 03/19/19 0250 03/20/19 0429 03/21/19 0430  WBC  --  14.3* 9.9 7.7 9.5 27.8*  CREATININE  --  0.85 0.89 0.85 0.85 1.84*  LATICACIDVEN 2.6*  --   --   --   --   --     Estimated Creatinine Clearance: 48.5 mL/min (A) (by C-G formula based on SCr of 1.84 mg/dL (H)).    Not on File  Antimicrobials this admission: 12/14 remdesivir>> 12/18 12/14 Ceftriaxone >> 12/14 12/14 and 12/15 Actemra  12/20 Vancomycin >> 12/20 Cefepime >>  Dose adjustments this admission: 12/21 vancomycin random level = ___  Microbiology results: 12/14 SARS CoV2: positive 12/15 MRSA PCR: neg 12/14 Bcx: NGF 12/20 BCx: 12/20 Trach asp:  Thank you for allowing pharmacy to be a part of this patient's care.  Peggyann Juba, PharmD, BCPS Pharmacy: [phone] 03/21/2019 7:14 PM

## 2019-03-21 NOTE — Progress Notes (Signed)
Placed patient in prone position. E.T tube secured at 24cm at the lip with cloth tape. Patient remained stable during procedure.

## 2019-03-21 NOTE — Progress Notes (Addendum)
eLink Physician-Brief Progress Note Patient Name: David Robinson DOB: 1945-12-20 MRN: [id number]   Date of Service  03/21/2019  HPI/Events of Note  Hypotension - BP = 95/49 with MAP = 64. Likely related to sedation.   eICU Interventions  Will order: 1. Phenylephrine IV infusion via PIV. Titrate to MAP >= 65.      Intervention Category Major Interventions: Hypotension - evaluation and management  [name] 03/21/2019, 1:14 AM

## 2019-03-21 NOTE — Progress Notes (Signed)
Called wife Discussed prognosis Okay for everything (dialysis, pressors etc) short of chest compressions/shocks DNR entered  Erskine Emery MD

## 2019-03-21 NOTE — Procedures (Signed)
Intubation Procedure Note David Robinson [id number] 1945/12/28  Procedure: Intubation Indications: Respiratory insufficiency  Procedure Details Consent: verbal consent obtained Time Out: Verified patient identification, verified procedure, site/side was marked, verified correct patient position, special equipment/implants available, medications/allergies/relevent history reviewed, required imaging and test results available.  Performed RSI with versed 15m, etomidate 264m rocuronium 10089mMaximum sterile technique was used including cap, gloves, gown and mask.  MAC and 3 on glideoscope.  Grade 1 view, easy intubation.     Evaluation Hemodynamic Status: BP stable throughout; O2 sats: transiently fell during during procedure Patient's Current Condition: stable Complications: No apparent complications Patient did tolerate procedure well. Chest X-ray ordered to verify placement.  CXR: tube position acceptable.   David Robinson/20/2020

## 2019-03-21 NOTE — Procedures (Signed)
Central Venous Catheter Insertion Procedure Note David Robinson [id number] 15-Oct-1945  Procedure: Insertion of Central Venous Catheter Indications: Drug and/or fluid administration  Procedure Details Consent: Risks of procedure as well as the alternatives and risks of each were explained to the (patient/caregiver).  Consent for procedure obtained. Time Out: Verified patient identification, verified procedure, site/side was marked, verified correct patient position, special equipment/implants available, medications/allergies/relevent history reviewed, required imaging and test results available.  Performed  Maximum sterile technique was used including antiseptics, cap, gloves, gown, hand hygiene, mask and sheet. Skin prep: Chlorhexidine; local anesthetic administered A antimicrobial bonded/coated triple lumen catheter was placed in the left internal jugular vein using the Seldinger technique.  Evaluation Blood flow good Complications: No apparent complications Patient did tolerate procedure well. Chest X-ray ordered to verify placement.  CXR: pending.  David Robinson 03/21/2019, 1:58 PM

## 2019-03-21 NOTE — Progress Notes (Signed)
Intubated overnight and now under care of PCCM. Please let us know if we can be of assistance [facility].  Will sign off.

## 2019-03-21 NOTE — Progress Notes (Signed)
eLink Physician-Brief Progress Note Patient Name: David Robinson DOB: [birth year] MRN: [id number]   Date of Service  03/21/2019  HPI/Events of Note  Hypotension - BP = 85/63 on Phenylephrine via PIV. No CVl or CVP.  eICU Interventions  Will order: 1. Vasopressin IV infusion at shock dose.  2. ABG STAT.     Intervention Category Major Interventions: Hypotension - evaluation and management  David Robinson 03/21/2019, 6:15 AM

## 2019-03-21 NOTE — Progress Notes (Signed)
[facility] for Heparin Indication: DVT  Not on File  Patient Measurements: Height: 6' (182.9 cm) Weight: 279 lb 12.2 oz (126.9 kg) IBW/kg (Calculated) : 77.6 Heparin dosing weight: 106 kg  Vital Signs: Temp: 98.8 F (37.1 C) (12/20 0400) Temp Source: Axillary (12/20 0730) BP: 128/74 (12/20 0750) Pulse Rate: 81 (12/20 0750)  Labs: Recent Labs    03/19/19 0250 03/20/19 0429 03/20/19 2204 03/21/19 0430 03/21/19 0620  HGB 15.3 16.2 17.0 16.1 16.7  HCT 45.3 48.2 50.0 50.1 49.0  PLT 193 156  --  242  --   CREATININE 0.85 0.85  --  1.84*  --     Estimated Creatinine Clearance: 48.5 mL/min (A) (by C-G formula based on SCr of 1.84 mg/dL (H)).  Medications:  Scheduled:  . acetaminophen  650 mg Oral Once  . vitamin C  500 mg Oral Daily  . busPIRone  5 mg Oral BID  . chlorhexidine  15 mL Mouth Rinse BID  . Chlorhexidine Gluconate Cloth  6 each Topical Daily  . dexamethasone (DECADRON) injection  6 mg Intravenous Q24H  . feeding supplement (VITAL HIGH PROTEIN)  1,000 mL Per Tube Q24H  . insulin aspart  0-15 Units Subcutaneous TID WC  . insulin aspart  0-5 Units Subcutaneous QHS  . insulin aspart  3 Units Subcutaneous TID WC  . insulin glargine  15 Units Subcutaneous QHS  . mouth rinse  15 mL Mouth Rinse q12n4p  . polyethylene glycol  17 g Oral BID  . sodium chloride flush  3 mL Intravenous Q12H  . sodium chloride flush  3 mL Intravenous Q12H  . zinc sulfate  220 mg Oral Daily   . sodium chloride    . sodium chloride 1,000 mL (03/21/19 0216)  . sodium chloride    . famotidine (PEPCID) IV    . fentaNYL    . fentaNYL infusion INTRAVENOUS 25 mcg/hr (03/20/19 2141)  . lactated ringers    . phenylephrine (NEO-SYNEPHRINE) Adult infusion 200 mcg/min (03/21/19 0903)  . propofol (DIPRIVAN) infusion 60 mcg/kg/min (03/21/19 0917)  . vasopressin (PITRESSIN) infusion - *FOR SHOCK* 0.03 Units/min (03/21/19 4270)     Assessment: 73 y/o male  with a PMH significant for HTN and DM, presenting to [facility] with SOB on 12/14, has now been transferred to [facility] ICU. Pharmacy has been consulted to start therapeutic Lovenox d/t concern for PE with Ddimer >20.  No CTa done d/t concern for AKI.  12/16 LE dopplers + bilateral DVT Today with worsening AKI (SCr 0.85 > 1.84) pharmacy is consulted to transition to Heparin IV. Last Lovenox dose on 12/19 at 18:49. CBC: stable, WNL D-dimer is now decreased to 9.6 LFT elevated, but improving.  AST/ALT 61/29, Tbili 1.5  Goal of Therapy:  Anti-Xa level 0.6-1 units/ml 4hrs after LMWH dose given Monitor platelets by anticoagulation protocol: Yes   Plan:  D/c Lovenox Baseline aPTT, Heparin level No heparin bolus d/t AKI on Lovenox, f/u baseline HL. Start heparin IV infusion at 1700 units/hr (16 units/kg/hr) Heparin level 8 hours after starting Daily heparin level and CBC Monitor for s/s bleeding.   Gretta Arab PharmD, BCPS Clinical pharmacist phone 7am- 5pm: [phone] 03/21/2019 9:24 AM

## 2019-03-22 ENCOUNTER — Inpatient Hospital Stay (HOSPITAL_COMMUNITY): Payer: Medicare Other

## 2019-03-22 DIAGNOSIS — I509 Heart failure, unspecified: Secondary | ICD-10-CM

## 2019-03-22 LAB — BASIC METABOLIC PANEL
Anion gap: 28 — ABNORMAL HIGH (ref 5–15)
BUN: 54 mg/dL — ABNORMAL HIGH (ref 8–23)
CO2: 9 mmol/L — ABNORMAL LOW (ref 22–32)
Calcium: 7.2 mg/dL — ABNORMAL LOW (ref 8.9–10.3)
Chloride: 106 mmol/L (ref 98–111)
Creatinine, Ser: 3.19 mg/dL — ABNORMAL HIGH (ref 0.61–1.24)
GFR calc Af Amer: 21 mL/min — ABNORMAL LOW (ref >60)
GFR calc non Af Amer: 18 mL/min — ABNORMAL LOW (ref >60)
Glucose, Bld: 124 mg/dL — ABNORMAL HIGH (ref 70–99)
Potassium: 5.4 mmol/L — ABNORMAL HIGH (ref 3.5–5.1)
Sodium: 143 mmol/L (ref 135–145)

## 2019-03-22 LAB — POCT I-STAT 7, (LYTES, BLD GAS, ICA,H+H)
Acid-base deficit: 22 mmol/L — ABNORMAL HIGH (ref 0.0–2.0)
Acid-base deficit: 25 mmol/L — ABNORMAL HIGH (ref 0.0–2.0)
Bicarbonate: 15.1 mmol/L — ABNORMAL LOW (ref 20.0–28.0)
Bicarbonate: 11.2 mmol/L — ABNORMAL LOW (ref 20.0–28.0)
Calcium, Ion: 1.05 mmol/L — ABNORMAL LOW (ref 1.15–1.40)
Calcium, Ion: 1.02 mmol/L — ABNORMAL LOW (ref 1.15–1.40)
Calcium, Ion: 1.01 mmol/L — ABNORMAL LOW (ref 1.15–1.40)
HCT: 53 % — ABNORMAL HIGH (ref 39.0–52.0)
HCT: 53 % — ABNORMAL HIGH (ref 39.0–52.0)
HCT: 52 % (ref 39.0–52.0)
Hemoglobin: 18 g/dL — ABNORMAL HIGH (ref 13.0–17.0)
Hemoglobin: 18 g/dL — ABNORMAL HIGH (ref 13.0–17.0)
Hemoglobin: 17.7 g/dL — ABNORMAL HIGH (ref 13.0–17.0)
O2 Saturation: 58 %
O2 Saturation: 72 %
Patient temperature: 98.6
Patient temperature: 98.6
Patient temperature: 36.6
Potassium: 4.4 mmol/L (ref 3.5–5.1)
Potassium: 4.6 mmol/L (ref 3.5–5.1)
Potassium: 4.8 mmol/L (ref 3.5–5.1)
Sodium: 138 mmol/L (ref 135–145)
Sodium: 140 mmol/L (ref 135–145)
Sodium: 138 mmol/L (ref 135–145)
TCO2: 18 mmol/L — ABNORMAL LOW (ref 22–32)
TCO2: 13 mmol/L — ABNORMAL LOW (ref 22–32)
pCO2 arterial: >97 mmHg (ref 32.0–48.0)
pCO2 arterial: 94.7 mmHg (ref 32.0–48.0)
pCO2 arterial: 72.7 mmHg (ref 32.0–48.0)
pH, Arterial: 6.835 — CL (ref 7.350–7.450)
pH, Arterial: 6.811 — CL (ref 7.350–7.450)
pH, Arterial: 6.793 — CL (ref 7.350–7.450)
pO2, Arterial: 62 mmHg — ABNORMAL LOW (ref 83.0–108.0)
pO2, Arterial: 56 mmHg — ABNORMAL LOW (ref 83.0–108.0)
pO2, Arterial: 70 mmHg — ABNORMAL LOW (ref 83.0–108.0)

## 2019-03-22 LAB — GLUCOSE, CAPILLARY
Glucose-Capillary: 124 mg/dL — ABNORMAL HIGH (ref 70–99)
Glucose-Capillary: 165 mg/dL — ABNORMAL HIGH (ref 70–99)
Glucose-Capillary: 185 mg/dL — ABNORMAL HIGH (ref 70–99)
Glucose-Capillary: 198 mg/dL — ABNORMAL HIGH (ref 70–99)
Glucose-Capillary: 79 mg/dL (ref 70–99)

## 2019-03-22 LAB — CBC
HCT: 55 % — ABNORMAL HIGH (ref 39.0–52.0)
Hemoglobin: 16.7 g/dL (ref 13.0–17.0)
MCH: 32.4 pg (ref 26.0–34.0)
MCHC: 30.4 g/dL (ref 30.0–36.0)
MCV: 106.6 fL — ABNORMAL HIGH (ref 80.0–100.0)
Platelets: 150 10*3/uL (ref 150–400)
RBC: 5.16 MIL/uL (ref 4.22–5.81)
RDW: 15.9 % — ABNORMAL HIGH (ref 11.5–15.5)
WBC: 3.1 10*3/uL — ABNORMAL LOW (ref 4.0–10.5)
nRBC: 30.9 % — ABNORMAL HIGH (ref 0.0–0.2)

## 2019-03-22 LAB — HEPARIN LEVEL (UNFRACTIONATED)
Heparin Unfractionated: 1.56 IU/mL — ABNORMAL HIGH (ref 0.30–0.70)
Heparin Unfractionated: 0.2 IU/mL — ABNORMAL LOW (ref 0.30–0.70)

## 2019-03-22 LAB — TRIGLYCERIDES: Triglycerides: 1205 mg/dL — ABNORMAL HIGH (ref <150)

## 2019-03-22 LAB — PROCALCITONIN: Procalcitonin: 4.65 ng/mL

## 2019-03-22 LAB — VANCOMYCIN, RANDOM

## 2019-03-22 LAB — MAGNESIUM: Magnesium: 2.6 mg/dL — ABNORMAL HIGH (ref 1.7–2.4)

## 2019-03-22 LAB — LACTIC ACID, PLASMA: Lactic Acid, Venous: >11 mmol/L (ref 0.5–1.9)

## 2019-03-22 LAB — PHOSPHORUS: Phosphorus: 11.2 mg/dL — ABNORMAL HIGH (ref 2.5–4.6)

## 2019-03-22 MED ORDER — CHLORHEXIDINE GLUCONATE 0.12% ORAL RINSE (MEDLINE KIT): 15.0000 mL | Freq: Two times a day (BID) | OROMUCOSAL | Status: DC

## 2019-03-22 MED ORDER — SODIUM CHLORIDE 0.9 % IV BOLUS
500.0000 mL | Freq: Once | INTRAVENOUS | Status: AC
  Administered 2019-03-22: 07:00:00 500 mL via INTRAVENOUS

## 2019-03-22 MED ORDER — SODIUM BICARBONATE 8.4 % IV SOLN
INTRAVENOUS | Status: AC
  Administered 2019-03-22: 50 meq
  Filled 2019-03-22: qty 50

## 2019-03-22 MED ORDER — SODIUM BICARBONATE 8.4 % IV SOLN
50.0000 meq | Freq: Once | INTRAVENOUS | Status: AC
  Administered 2019-03-22: 50 meq via INTRAVENOUS

## 2019-03-22 MED ORDER — HEPARIN (PORCINE) 25000 UT/250ML-% IV SOLN: 1500.0000 [IU]/h | INTRAVENOUS | Status: DC

## 2019-03-22 MED ORDER — HEPARIN (PORCINE) 25000 UT/250ML-% IV SOLN: 1000.0000 [IU]/h | INTRAVENOUS | Status: DC

## 2019-03-22 MED ORDER — ORAL CARE MOUTH RINSE
15.0000 mL | OROMUCOSAL | Status: DC
  Administered 2019-03-22 (×2): 15 mL via OROMUCOSAL

## 2019-04-02 NOTE — Progress Notes (Signed)
Seen in follow-up for severe Covid pneumonia  S: Went into multiorgan failure overnight.  Now with refractory acidosis.  Chest x-ray revealing progressive cavitary infiltrates on left side.  Bedside ultrasound revealing no fluid.  Patient is comatose.  O: On high-dose pressors, proned, GCS 3 Maps in 40s on multiple pressors Lung sounds diminished bilaterally Ultrasound as noted in subjective  Lactate >11 Pct 4.6 ABG 6.8/72 despite multiple bicarb pushes H/H stable BMP/CBC with ?questionable results  A: Multiorgan failure secondary to severe Covid pneumonia with possible superimposed bacterial infection.  He has reached a nonsurvivable clinical state and is actively dying.  P: There are no further medical interventions that we can offer.  I have called the wife, death expected within the next hour or two.  30 minutes critical care time  Erskine Emery MD PCCM

## 2019-04-02 NOTE — Progress Notes (Signed)
Called E link regarding pt's BP, advised to do ABG( see results). RR= increased to 35 TV increased to 500. Vasopressin ceiling dose increased to 0.05 units/hour. I also managed to contact the wife and updated her of pt's present condition.

## 2019-04-02 NOTE — Progress Notes (Signed)
PT Cancellation Note  Patient Details Name: David Robinson MRN: [id number] DOB: 1945/05/30   Cancelled Treatment:    Reason Eval/Treat Not Completed: Patient not medically ready.  Vent settings are outside of parameters for PT/OT to start working with pt.  We will follow along for more medical stability.   Thanks,  Verdene Lennert, PT, DPT  Acute Rehabilitation [phone] pager [phone] office  @ [facility]: [phone]     Harvie Heck 28-Mar-2019, 8:53 AM

## 2019-04-02 NOTE — Progress Notes (Signed)
CDS is releasing pt at this time - not a donor. Spoke with Ronna Polio - referral # [phone]. Bedside RN [place] notified.

## 2019-04-02 NOTE — Progress Notes (Signed)
Pt ABG not crossing over due to brackets  PH 6.8  CO2 greater than 97   O2 62  Every other numerical value was in brackets. RN and Elink notified. RR on ventilator increased to 35

## 2019-04-02 NOTE — Progress Notes (Signed)
eLink Physician-Brief Progress Note Patient Name: David Robinson DOB: May 16, 1945 MRN: [id number]   Date of Service  03/16/2019  HPI/Events of Note  Notified of hypotension and ABG 6.8/>97/62 on AC 30/470/100%/5 PEEP, peak pressure 40, plateau pressure 30  eICU Interventions  Discusses with bedside RN and RT. Not much room for adjustment. Increased vasopressin max to 0.05 and norepinephrine to 50. Increased TV to 400. RR was increased earlier to 35 with I:E 1:1.2. Repeat ABG in  1 hour.  Family updated by bedside.        Judd Lien 03/26/2019, 3:43 AM

## 2019-04-02 NOTE — Progress Notes (Signed)
Latest results of ABG are as follows: PH=6.81 PCo2=91.7 PO2=56 HCO3=15.1 Base def= -22. Awaiting orders from CCM.

## 2019-04-02 NOTE — Progress Notes (Addendum)
ANTICOAGULATION CONSULT NOTE  Pharmacy Consult for Heparin Indication: DVT  Not on File  Patient Measurements: Height: 6' (182.9 cm) Weight: 281 lb 1.4 oz (127.5 kg) IBW/kg (Calculated) : 77.6 Heparin dosing weight: 106 kg  Vital Signs: Temp: 97.9 F (36.6 C) (12/21 0300) Temp Source: Oral (12/21 0300) BP: 108/61 (12/21 0315) Pulse Rate: 115 (12/21 0245)  Labs: Recent Labs    03/20/19 0429 03/21/19 0430 03/21/19 0620 03/21/19 0620 03/21/19 1006 03/21/19 1510 03/21/19 1815 03/21/19 2240 03/17/2019 0245 03/11/2019 0311  HGB 16.2 16.1 16.7  --   --  16.0  --   --   --  18.0*  HCT 48.2 50.1 49.0  --   --  47.0  --   --   --  53.0*  PLT 156 242  --   --   --   --   --   --   --   --   APTT  --   --   --   --  41*  --   --   --   --   --   HEPARINUNFRC  --   --   --    < > 1.32*  --  >2.20* 1.56* 0.72*  --   CREATININE 0.85 1.84*  --   --   --   --   --   --   --   --    < > = values in this interval not displayed.    Estimated Creatinine Clearance: 48.6 mL/min (A) (by C-G formula based on SCr of 1.84 mg/dL (H)).  Assessment: 74 y/o male with a PMH significant for HTN and DM, presenting to [facility] with SOB on 12/14, has now been transferred to [facility] ICU. Pharmacy has been consulted to start therapeutic Lovenox d/t concern for PE with Ddimer >20.  No CTa done d/t concern for AKI.  12/16 LE dopplers + bilateral DVT Today with worsening AKI (SCr 0.85 > 1.84) pharmacy is consulted to transition to Heparin IV. Last Lovenox dose on 12/19 at 18:49. CBC: stable, WNL D-dimer is now decreased to 9.6 LFT elevated, but improving.  AST/ALT 61/29, Tbili 1.5  03/23/2019 0440  -Heparin level:  reported as  0.72 IU/mL, but lab is questioning accuracy of draw  -RN reports pink-tinged secretions and  some bleeding from the nose (to be   expected w/prone position)  Goal of Therapy:  Heparin level 0.3 - 0.7 Monitor platelets by anticoagulation protocol: Yes   Plan:  -Stop  heparin drip now and  re-draw HL at 0700 -Start heparin infusion when HL is <0.70 IU/mL - Monitor for s/s bleeding.  Despina Pole, Pharm. D. Clinical Pharmacist 03/27/2019 4:42 AM

## 2019-04-02 NOTE — Discharge Summary (Signed)
Date of Admission: 2019-04-01 Date of Death: Apr 08, 2019 Diagnoses: Multiorgan failure due to COVID infection Hospital Course: Patient was admitted with severe COVID pneumonia that progressed to multiorgan failure. He unfortunately succumbed to this disease process.  Erskine Emery MD PCCM

## 2019-04-02 NOTE — Progress Notes (Addendum)
Pharmacy  Antibiotic Note: Vancomycin random level update  David Robinson is a 74 y.o. male admitted on 03/04/2019 with respiratory failure due to COVID-19.  Vancomycin was started yesterday along with cefepime for empiric coverage of sepsis.  Patient received a vancomycin 2g load at 2000 on 12/20 and vancomycin random level was drawn at 0200.  A/P: - Random vancomycin level: 56mcg/mL-->not accurate, as lab reports error in sample  - Scr is now  2.30mcg/dL-->lab to re-run sample  -Is/Os:  6154/185  -Re-check random vancomycin level at 1200 today and re-dose when level<44mcg/mL  Height: 6' (182.9 cm) Weight: 281 lb 1.4 oz (127.5 kg) IBW/kg (Calculated) : 77.6  Temp (24hrs), Avg:97.9 F (36.6 C), Min:97.5 F (36.4 C), Max:98.5 F (36.9 C)  Recent Labs  Lab 03/12/2019 0923 03/17/19 0630 03/18/19 0645 03/19/19 0250 03/20/19 0429 03/21/19 0430 04/19/19 0200  WBC  --  14.3* 9.9 7.7 9.5 27.8*  --   CREATININE  --  0.85 0.89 0.85 0.85 1.84* 2.69*  LATICACIDVEN 2.6*  --   --   --   --   --   --   VANCORANDOM  --   --   --   --   --   --  26    Estimated Creatinine Clearance: 33.3 mL/min (A) (by C-G formula based on SCr of 2.69 mg/dL (H)).    Antimicrobials this admission: 12/14 remdesivir>> 12/18 12/14 Ceftriaxone >> 12/14 12/14 and 12/15 Actemra  12/20 Vancomycin >> 12/20 Cefepime >>   Microbiology results: 12/14 SARS CoV2: positive 12/15 MRSA PCR: neg 12/14 Bcx: NGF 12/20 BCx: NG x5 days 12/20 Trach asp:  Thank you for allowing pharmacy to be a part of this patient's care.  Despina Pole, Pharm. D. Clinical Pharmacist 19-Apr-2019 5:32 AM

## 2019-04-02 NOTE — Progress Notes (Signed)
Desaturated to 80% after vent setting changes were done. RT informed. Assessing pt at bedside CXR done. Informed CCM of further episode of hypotension to 31'V systolic.

## 2019-04-02 NOTE — Progress Notes (Signed)
240ml of Fentanyl wasted in steriwaste with [place].

## 2019-04-02 NOTE — Progress Notes (Signed)
[facility] for Heparin Indication: DVT  Not on File  Patient Measurements: Height: 6' (182.9 cm) Weight: 279 lb 12.2 oz (126.9 kg) IBW/kg (Calculated) : 77.6 Heparin dosing weight: 106 kg  Vital Signs: Temp: 97.7 F (36.5 C) (12/20 2200) Temp Source: Oral (12/20 2200) BP: 111/59 (12/21 0000) Pulse Rate: 120 (12/21 0030)  Labs: Recent Labs    03/19/19 0250 03/20/19 0429 03/21/19 0430 03/21/19 0620 03/21/19 1006 03/21/19 1510 03/21/19 1815 03/21/19 2240  HGB 15.3 16.2 16.1 16.7  --  16.0  --   --   HCT 45.3 48.2 50.1 49.0  --  47.0  --   --   PLT 193 156 242  --   --   --   --   --   APTT  --   --   --   --  41*  --   --   --   HEPARINUNFRC  --   --   --   --  1.32*  --  >2.20* 1.56*  CREATININE 0.85 0.85 1.84*  --   --   --   --   --     Estimated Creatinine Clearance: 48.5 mL/min (A) (by C-G formula based on SCr of 1.84 mg/dL (H)).   Assessment: 74 y/o male with a PMH significant for HTN and DM, presenting to [facility] with SOB on 12/14, has now been transferred to [facility] ICU. Pharmacy has been consulted to start therapeutic Lovenox d/t concern for PE with Ddimer >20.  No CTa done d/t concern for AKI.  12/16 LE dopplers + bilateral DVT Today with worsening AKI (SCr 0.85 > 1.84) pharmacy is consulted to transition to Heparin IV. Last Lovenox dose on 12/19 at 18:49. CBC: stable, WNL D-dimer is now decreased to 9.6 LFT elevated, but improving.  AST/ALT 61/29, Tbili 1.5  04/01/2019 0052:  Heparin level:  1.56IU/mL   (heparin drip on hold for ~4 hours) Possibly still reflective of residual lovenox accumulation with rising Scr  No bleeding complications per discussion with RN.  Goal of Therapy:  Heparin level 0.3 - 0.7 Monitor platelets by anticoagulation protocol: Yes   Plan:  Hold heparin drip x 2  more hours  Re-check heparin level at 0300 Once level < 0.7, will resume heparin drip Monitor for s/s bleeding.  Despina Pole, Pharm.  D. Clinical Pharmacist 2019-04-01 12:55 AM

## 2019-04-02 NOTE — Progress Notes (Signed)
Attempted to call CDS at 1010 per bedside RN request.  CDS's extended call center took call. Was told, CDS would be calling myself back shortly.  Bedside RN w/ pt notified.

## 2019-04-02 NOTE — Progress Notes (Signed)
Tried to update the wife regarding pt's condition. None answered. Left a voice message.

## 2019-04-02 NOTE — Progress Notes (Addendum)
eLink Physician-Brief Progress Note Patient Name: ZAYVIEN CANNING DOB: [birth year] MRN: [id number]   Date of Service  04-06-19  HPI/Events of Note  CXR appears to have increased dense opacification on the left.  eICU Interventions  Inquiring if hospitalist able to do bedside US and assess for presence of effusion. ABG now at 7.93/73/70. Ordered 1 amp bicarb and get lactic acid level.     Intervention Category Major Interventions: Respiratory failure - evaluation and management  Judd Lien Apr 06, 2019, 5:27 AM

## 2019-04-02 DEATH — deceased

## 2020-10-15 IMAGING — DX DG CHEST 1V PORT
1 series · 1 of 1 positions shown · non-contrast
Comparison: 12/19/2015

CLINICAL DATA: Shortness of breath

EXAM:
PORTABLE CHEST 1 VIEW

[chest ap]
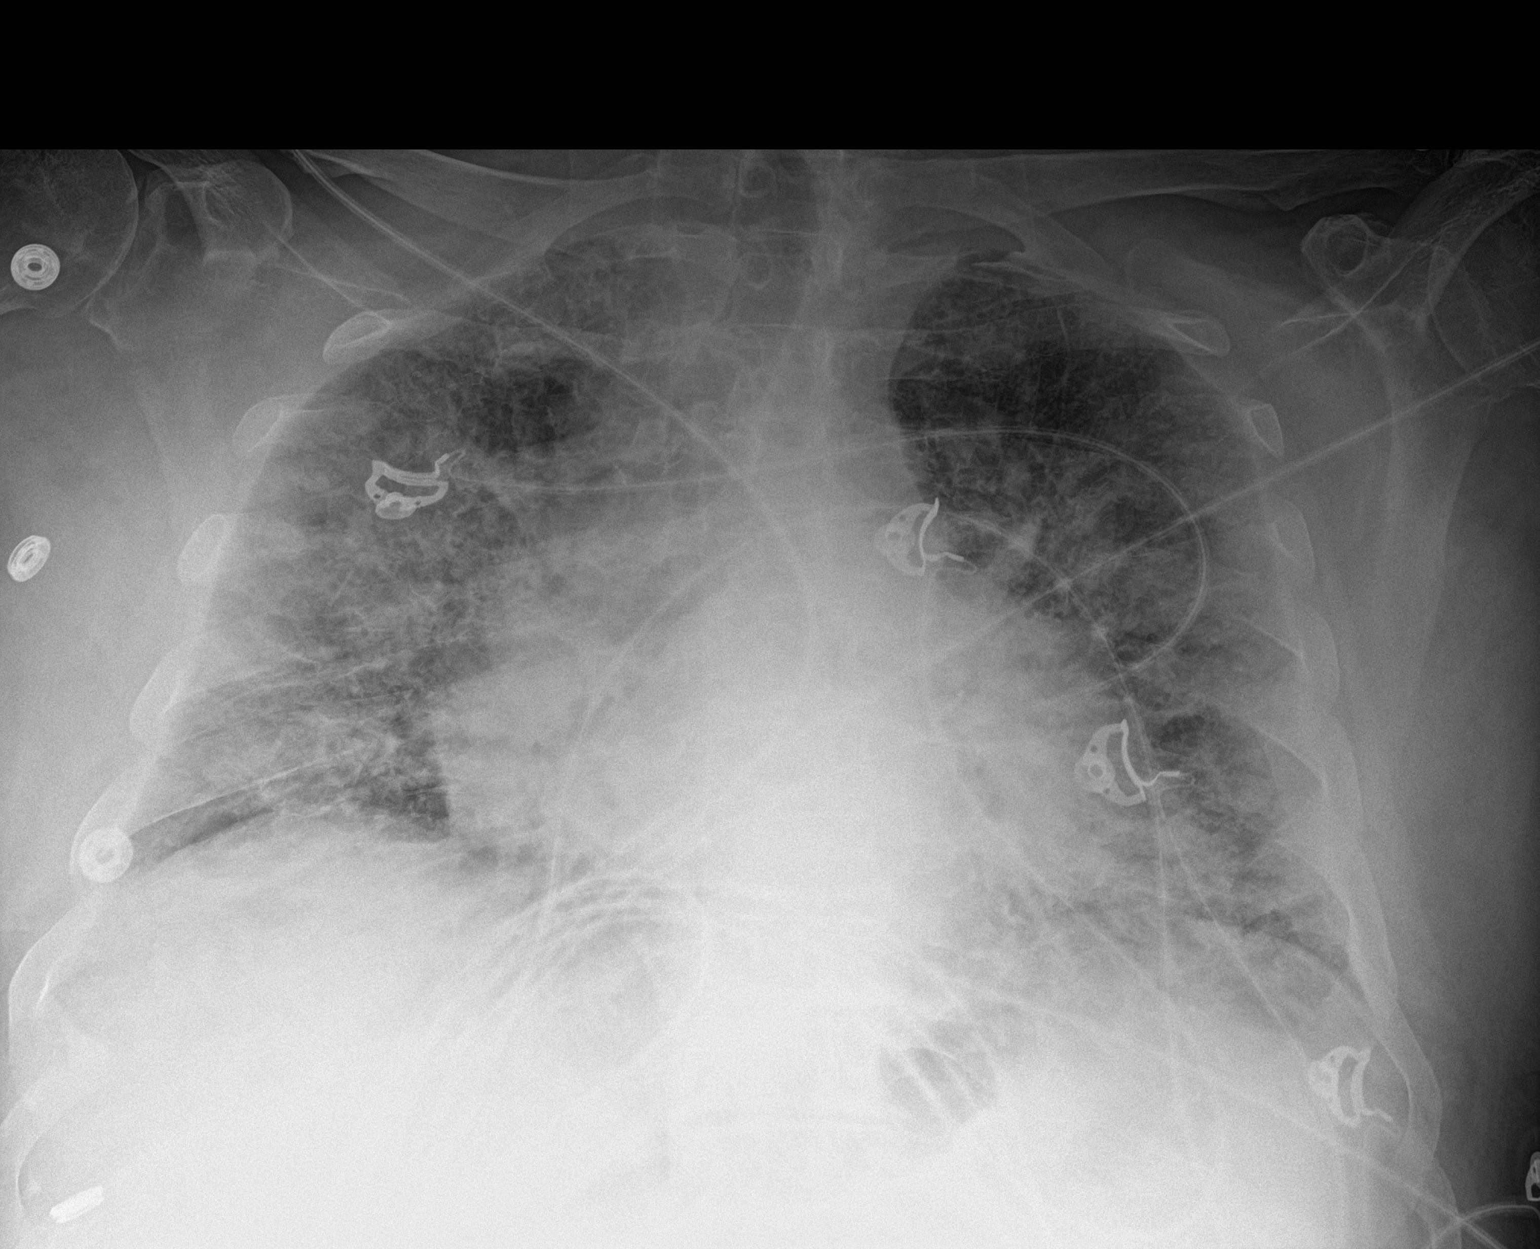

[1 of 1 positions shown; findings below may reference images not displayed]

FINDINGS: Lung volumes are low with diffuse interstitial and airspace
opacities.

Cardiomediastinal contours are likely normal accounting for low
depth of inspiration and portable technique.

No signs of lobar consolidation or visible effusion.
IMPRESSION: Diffuse interstitial and airspace opacities may represent pneumonia
or edema.

## 2020-10-21 IMAGING — DX DG CHEST 1V PORT
1 series · 1 of 1 positions shown · non-contrast
Comparison: 12/19/2018

CLINICAL DATA: Central line placement.

EXAM:
PORTABLE CHEST 1 VIEW

[chest ap]
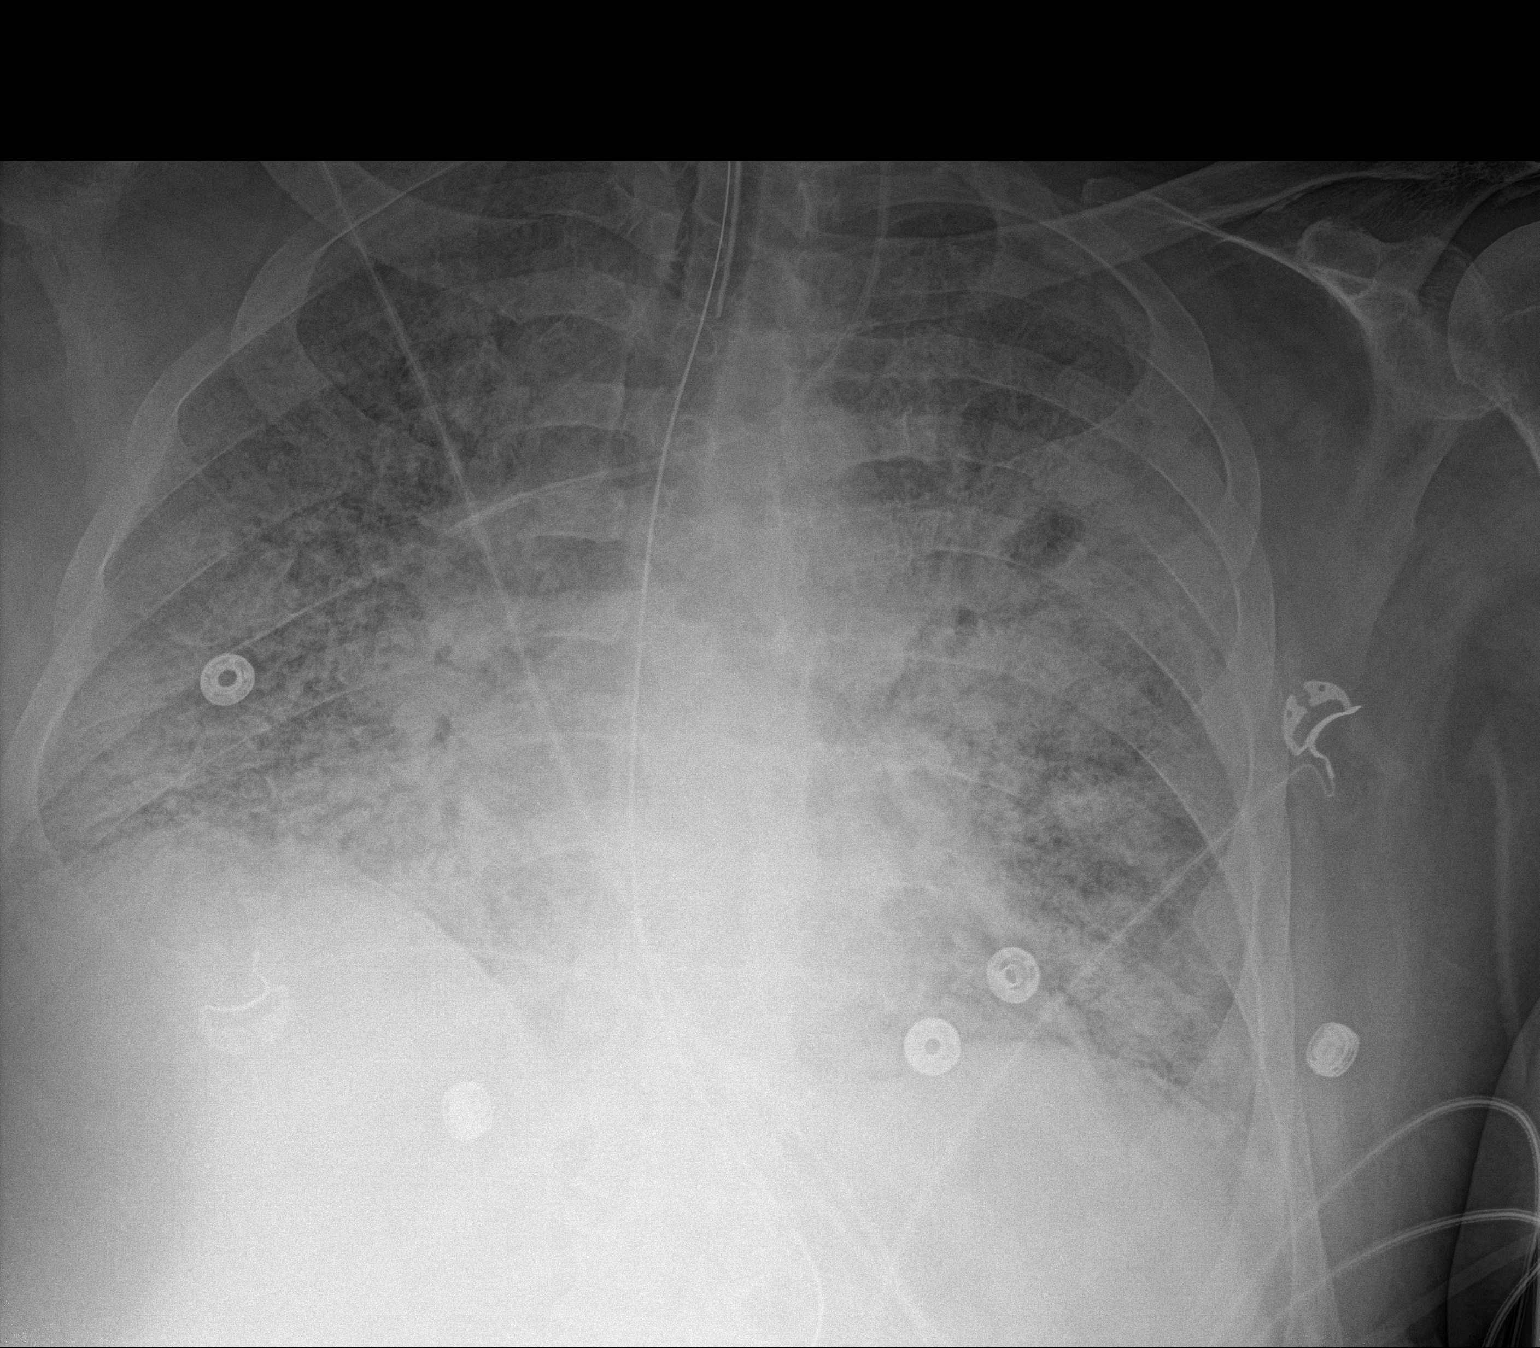

[1 of 1 positions shown; findings below may reference images not displayed]

FINDINGS: Cardiomediastinal silhouette is obscured.

An endotracheal tube with tip 6.5 cm above the carina an NG tube
entering the stomach with tip off the field of view again noted.

A LEFT IJ central venous catheter is now present with tip overlying
the region of the mid-UPPER SVC.

There is no evidence of pneumothorax.

Diffuse bilateral airspace opacities are unchanged.
IMPRESSION: 1. New LEFT IJ central venous catheter with tip overlying the region
of the mid-UPPER SVC. No evidence of pneumothorax.
2. Unchanged diffuse bilateral airspace opacities.

## 2020-10-22 IMAGING — DX DG CHEST 1V
1 series · 1 of 1 positions shown · non-contrast
Comparison: Radiograph yesterday at [DATE] p.m.

CLINICAL DATA: ARDS.

EXAM:
CHEST  1 VIEW

[chest ap]
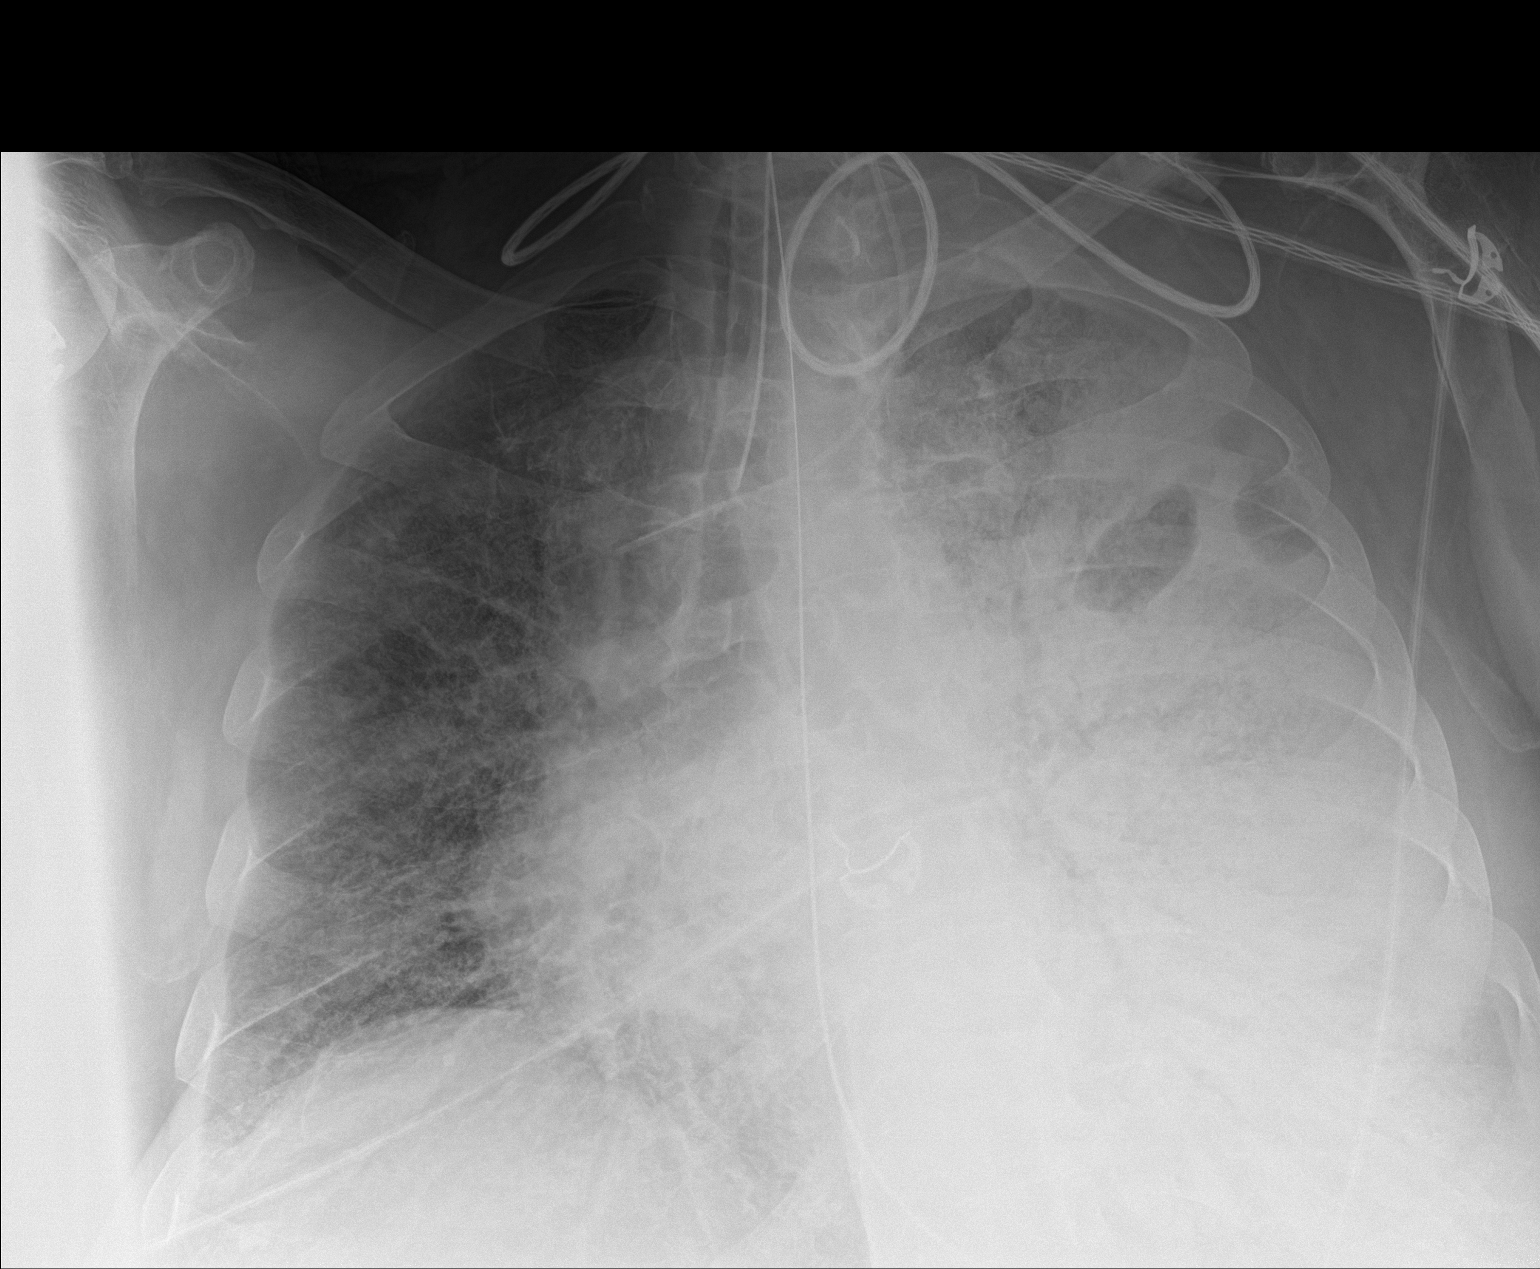

[1 of 1 positions shown; findings below may reference images not displayed]

FINDINGS: Endotracheal tube tip below the clavicular heads. Enteric tube in
place with tip not included in the field of view. Left internal
jugular central venous catheter tip projects over the upper SVC.
Progressive opacities throughout the left lung since prior exam with
increasing air bronchograms. There are 2 ovoid lucencies projecting
over the left upper lung zone measuring 4.0 and 2.7 cm. This is
increased in size from prior exam. Right lung opacities have
slightly improved since yesterday. Definite pleural effusion. No
visualized pneumothorax.
IMPRESSION: 1. Shifting bilateral airspace opacities with worsening on the left
but improvement on the right.
2. Two ovoid lucencies projecting over the left upper lung zone,
increased in size from yesterday, nonspecific radiographic
appearance. This may represent pneumatoceles, cavitary process, or
less likely localized subcutaneous emphysema.
3. Stable support apparatus.
